# Patient Record
Sex: Female | Born: 1975 | Race: Black or African American | Hispanic: No | Marital: Single | Smoking: Current every day smoker
Health system: Southern US, Community
[De-identification: ages and names within clinical notes are randomized; demographics above are authoritative.]

## PROBLEM LIST (undated history)

## (undated) DIAGNOSIS — D649 Anemia, unspecified: Secondary | ICD-10-CM

## (undated) DIAGNOSIS — I2699 Other pulmonary embolism without acute cor pulmonale: Secondary | ICD-10-CM

## (undated) DIAGNOSIS — M5430 Sciatica, unspecified side: Secondary | ICD-10-CM

## (undated) DIAGNOSIS — G8929 Other chronic pain: Secondary | ICD-10-CM

## (undated) DIAGNOSIS — M549 Dorsalgia, unspecified: Secondary | ICD-10-CM

## (undated) DIAGNOSIS — I10 Essential (primary) hypertension: Secondary | ICD-10-CM

## (undated) HISTORY — PX: TUBAL LIGATION: SHX77

## (undated) HISTORY — PX: CHOLECYSTECTOMY: SHX55

---

## 2001-02-15 ENCOUNTER — Encounter: Payer: Self-pay | Admitting: *Deleted

## 2001-02-15 ENCOUNTER — Emergency Department (HOSPITAL_COMMUNITY): Admission: EM | Admit: 2001-02-15 | Discharge: 2001-02-15 | Payer: Self-pay | Admitting: *Deleted

## 2001-06-12 ENCOUNTER — Inpatient Hospital Stay (HOSPITAL_COMMUNITY): Admission: AD | Admit: 2001-06-12 | Discharge: 2001-06-13 | Payer: Self-pay | Admitting: Obstetrics and Gynecology

## 2004-06-13 ENCOUNTER — Ambulatory Visit (HOSPITAL_COMMUNITY): Admission: RE | Admit: 2004-06-13 | Discharge: 2004-06-13 | Payer: Self-pay | Admitting: Obstetrics and Gynecology

## 2004-06-15 ENCOUNTER — Observation Stay (HOSPITAL_COMMUNITY): Admission: AD | Admit: 2004-06-15 | Discharge: 2004-06-15 | Payer: Self-pay | Admitting: *Deleted

## 2004-07-13 ENCOUNTER — Ambulatory Visit: Payer: Self-pay | Admitting: *Deleted

## 2004-07-13 ENCOUNTER — Inpatient Hospital Stay (HOSPITAL_COMMUNITY): Admission: AD | Admit: 2004-07-13 | Discharge: 2004-07-17 | Payer: Self-pay | Admitting: Obstetrics and Gynecology

## 2004-08-18 ENCOUNTER — Observation Stay (HOSPITAL_COMMUNITY): Admission: RE | Admit: 2004-08-18 | Discharge: 2004-08-18 | Payer: Self-pay | Admitting: Obstetrics and Gynecology

## 2004-08-27 ENCOUNTER — Inpatient Hospital Stay (HOSPITAL_COMMUNITY): Admission: AD | Admit: 2004-08-27 | Discharge: 2004-08-31 | Payer: Self-pay | Admitting: Obstetrics and Gynecology

## 2004-09-04 ENCOUNTER — Emergency Department (HOSPITAL_COMMUNITY): Admission: EM | Admit: 2004-09-04 | Discharge: 2004-09-04 | Payer: Self-pay | Admitting: Emergency Medicine

## 2004-09-20 DIAGNOSIS — I2699 Other pulmonary embolism without acute cor pulmonale: Secondary | ICD-10-CM

## 2004-09-20 HISTORY — DX: Other pulmonary embolism without acute cor pulmonale: I26.99

## 2004-09-25 ENCOUNTER — Inpatient Hospital Stay (HOSPITAL_COMMUNITY): Admission: EM | Admit: 2004-09-25 | Discharge: 2004-09-28 | Payer: Self-pay | Admitting: Emergency Medicine

## 2004-10-03 ENCOUNTER — Ambulatory Visit: Payer: Self-pay | Admitting: Cardiology

## 2005-03-06 ENCOUNTER — Emergency Department (HOSPITAL_COMMUNITY): Admission: EM | Admit: 2005-03-06 | Discharge: 2005-03-06 | Payer: Self-pay | Admitting: Emergency Medicine

## 2005-03-06 ENCOUNTER — Observation Stay (HOSPITAL_COMMUNITY): Admission: EM | Admit: 2005-03-06 | Discharge: 2005-03-08 | Payer: Self-pay | Admitting: Emergency Medicine

## 2005-04-13 ENCOUNTER — Emergency Department (HOSPITAL_COMMUNITY): Admission: EM | Admit: 2005-04-13 | Discharge: 2005-04-13 | Payer: Self-pay | Admitting: Emergency Medicine

## 2005-10-06 ENCOUNTER — Emergency Department (HOSPITAL_COMMUNITY): Admission: EM | Admit: 2005-10-06 | Discharge: 2005-10-06 | Payer: Self-pay | Admitting: Emergency Medicine

## 2006-04-02 ENCOUNTER — Inpatient Hospital Stay (HOSPITAL_COMMUNITY): Admission: AD | Admit: 2006-04-02 | Discharge: 2006-04-05 | Payer: Self-pay | Admitting: Obstetrics & Gynecology

## 2006-04-07 ENCOUNTER — Inpatient Hospital Stay (HOSPITAL_COMMUNITY): Admission: EM | Admit: 2006-04-07 | Discharge: 2006-04-11 | Payer: Self-pay | Admitting: Emergency Medicine

## 2006-04-07 ENCOUNTER — Encounter (INDEPENDENT_AMBULATORY_CARE_PROVIDER_SITE_OTHER): Payer: Self-pay | Admitting: *Deleted

## 2006-04-12 ENCOUNTER — Inpatient Hospital Stay (HOSPITAL_COMMUNITY): Admission: EM | Admit: 2006-04-12 | Discharge: 2006-04-18 | Payer: Self-pay | Admitting: Emergency Medicine

## 2006-04-16 ENCOUNTER — Encounter (INDEPENDENT_AMBULATORY_CARE_PROVIDER_SITE_OTHER): Payer: Self-pay | Admitting: *Deleted

## 2006-04-19 ENCOUNTER — Emergency Department (HOSPITAL_COMMUNITY): Admission: EM | Admit: 2006-04-19 | Discharge: 2006-04-19 | Payer: Self-pay | Admitting: Emergency Medicine

## 2006-10-29 ENCOUNTER — Emergency Department (HOSPITAL_COMMUNITY): Admission: EM | Admit: 2006-10-29 | Discharge: 2006-10-29 | Payer: Self-pay | Admitting: Emergency Medicine

## 2007-05-30 ENCOUNTER — Emergency Department (HOSPITAL_COMMUNITY): Admission: EM | Admit: 2007-05-30 | Discharge: 2007-05-30 | Payer: Self-pay | Admitting: Emergency Medicine

## 2007-06-21 ENCOUNTER — Emergency Department (HOSPITAL_COMMUNITY): Admission: EM | Admit: 2007-06-21 | Discharge: 2007-06-21 | Payer: Self-pay | Admitting: Emergency Medicine

## 2010-02-16 ENCOUNTER — Emergency Department (HOSPITAL_COMMUNITY)
Admission: EM | Admit: 2010-02-16 | Discharge: 2010-02-16 | Payer: Self-pay | Source: Home / Self Care | Admitting: Emergency Medicine

## 2010-05-02 LAB — BASIC METABOLIC PANEL
BUN: 10 mg/dL (ref 6–23)
CO2: 27 mEq/L (ref 19–32)
Calcium: 8.7 mg/dL (ref 8.4–10.5)
Creatinine, Ser: 0.75 mg/dL (ref 0.4–1.2)
GFR calc non Af Amer: >60 mL/min (ref >60)
Glucose, Bld: 85 mg/dL (ref 70–99)

## 2010-05-02 LAB — URINALYSIS, ROUTINE W REFLEX MICROSCOPIC
Bilirubin Urine: NEGATIVE
Glucose, UA: NEGATIVE mg/dL
Urobilinogen, UA: 0.2 mg/dL (ref 0.0–1.0)
pH: 6 (ref 5.0–8.0)

## 2010-05-02 LAB — CBC
Hemoglobin: 8.5 g/dL — ABNORMAL LOW (ref 12.0–15.0)
MCH: 25.1 pg — ABNORMAL LOW (ref 26.0–34.0)
MCHC: 32.1 g/dL (ref 30.0–36.0)
Platelets: 255 10*3/uL (ref 150–400)
RDW: 16.9 % — ABNORMAL HIGH (ref 11.5–15.5)

## 2010-05-02 LAB — WET PREP, GENITAL
Clue Cells Wet Prep HPF POC: NONE SEEN
Trich, Wet Prep: NONE SEEN
Yeast Wet Prep HPF POC: NONE SEEN

## 2010-05-02 LAB — DIFFERENTIAL
Basophils Absolute: 0 10*3/uL (ref 0.0–0.1)
Basophils Relative: 0 % (ref 0–1)
Eosinophils Absolute: 0 10*3/uL (ref 0.0–0.7)
Monocytes Relative: 5 % (ref 3–12)
Neutro Abs: 3.4 10*3/uL (ref 1.7–7.7)
Neutrophils Relative %: 58 % (ref 43–77)

## 2010-05-02 LAB — URINE MICROSCOPIC-ADD ON

## 2010-05-02 LAB — GC/CHLAMYDIA PROBE AMP, GENITAL: GC Probe Amp, Genital: NEGATIVE

## 2010-07-08 NOTE — Discharge Summary (Signed)
NAME:  [name], [name] NO.:  [id number]   MEDICAL RECORD NO.:  [id number]          PATIENT TYPE:  INP   LOCATION:  A401                          FACILITY:  [facility]   PHYSICIAN:  Langley Gauss, MD     DATE OF BIRTH:  [birth year]   DATE OF ADMISSION:  08/27/2004  DATE OF DISCHARGE:  07/12/2006LH                                 DISCHARGE SUMMARY   The patient's observation status of August 27, 2004, converted to admission  status on August 28, 2004, with subsequent induction of labor on August 29, 2004,  with spontaneous assisted vaginal delivery over intact perineum also to  occurring on August 29, 2004.  Postpartum hospital care August 30, 2004, and  then discharged home on August 31, 2004.   DISPOSITION:  The patient given a copy of standard discharge instructions.   FOLLOW UP:  Follow up in the office in 4 weeks' time.  However, she should  present to the office prior to that time if she does desire permanent  sterilization as she is made aware that there is a 30-day waiting period  with Medicaid prior to performing the procedure.  This paper is available in  our office to be signed.   DISCHARGE MEDICATIONS:  1.  Tylox #20, with no refill.  2.  HCTZ 25 mg p.o. q.d. p.r.n. fluid retention, #30, with no refill.   LABORATORY DATA AND X-RAY FINDINGS:  On August 28, 2004, hemoglobin/hematocrit  10.3/29.7, white count of 7.2.  On August 29, 2004, hemoglobin 10.3,  hematocrit 30.0, white count 9.6.  On August 30, 2004, hemoglobin 10.6,  hematocrit 30.6, white count 16.1.  This was immediately obtained on  postpartum day #1.  Electrolytes within normal limits.  Urine drug screen  positive for THC only.  Additionally, urinalysis did reveal presence of  Trichomonas, thus she is continued on Flagyl 500 mg p.o. b.i.d. x7 days.  GBS carrier status negative.  This was obtained during the previous visit to  labor and delivery.   HISTORY OF PRESENT ILLNESS:  The patient states she had prenatal  care in  [place], but no records were ever supplied.  EDC was September 03, 2004.  The  patient presented the p.m. of August 27, 2004, as an OB unassigned patient  complaining of headache, backache, uterine contractions and abdominal pain.  She was initially placed on observation on August 27, 2004.  She was noted to  have mildly elevated blood pressures as well as 2+ proteinuria.  Her blood  pressure failed to normalize at bed rest and in addition, the 2+ proteinuria  persisted even after beginning treatment for the Trichomonas in the urine  with p.o. Flagyl.  The patient was admitted on August 28, 2004.   HOSPITAL COURSE:  A limited OB ultrasound greater than 14 weeks' gestation  performed on emergent basis by Dr. Lisette Grinder on August 28, 2004, which revealed  footling breech presentation.  Thus, the patient was scheduled for primary  low transverse cesarean section in a.m. of August 29, 2004.  However, I again  evaluated the patient on August 29, 2004, again limited OB ultrasound greater  than 14 weeks' gestation revealed spontaneous version to a cephalic  presentation.  Thus, induction of labor was performed.  Amniotomy was  performed.  Pitocin was initiated and the patient progressed to a vaginal  delivery over an intact perineum.  Postpartum, the patient was to be  continued on IV magnesium sulfate x24 hours for seizure prophylaxis.  However, her IV infiltrated several hours postpartum.  She refused to  restart and she refused administration of IV antibiotics.  This is typical  behavior in this noncompliant patient who has had no prenatal care other  than two prior visits to labor and delivery.  Subsequently, in the  postpartum state, her blood pressures have been 140s/90s, pulse of 80-90 and  respiratory rate is 20.  She does have significant dependent edema for which  the hydrochlorothiazide is administered.  The patient does desire infant  circumcision, but she is unable to meet her financial obligations  this  elective procedure.  Thus, she can arrange to have it done on an outpatient  basis if she so desires.       DC/MEDQ  D:  08/31/2004  T:  08/31/2004  Job:  [id number]

## 2010-07-08 NOTE — Group Therapy Note (Signed)
NAME:  [name], HANSELL NO.:  [id number]   MEDICAL RECORD NO.:  [id number]          PATIENT TYPE:  INP   LOCATION:  A401                          FACILITY:  [facility]   PHYSICIAN:  Langley Gauss, MD     DATE OF BIRTH:  [birth year]   DATE OF PROCEDURE:  08/30/2004  DATE OF DISCHARGE:                                   PROGRESS NOTE   POSTPARTUM DAY #1:  Apparently, the patient delivered vaginally late last  p.m.  Plan was to continue the magnesium sulfate x 24 hours' duration.  However, the IV infiltrated shortly after delivery.  The patient adamantly  refused restarting any IV and also refused administration of IM medications.  Thus, it was impossible to continue her magnesium sulfate for seizure  prophylaxis.  The patient was made aware that that was the entire indication  for the induction of labor was due to the presence of the preeclampsia, and  there was a risk of seizure, particularly within the first 24 hours  postpartum.  However, during this hospital stay as well as two other  previous visits to labor and delivery, the patient has shown an outright  refusal to do as directed such as following up with outpatient visits and  has, likewise on several occasions, refused medications, even though she  should realize it is only in her best interests.  The patient was  subsequently started on labetalol 100 mg p.o. b.i.d. to keep blood pressures  within an acceptable range.  Blood pressures have been primarily in the 130s-  140s/80s.  Abdomen and uterus are soft and nontender.  No uterine  tenderness.  No excessive vaginal bleeding.  She does have some dependent  edema.   POSTPARTUM LABORATORY STUDIES:  Hemoglobin 10.6, hematocrit 30.6, platelet  count of 229,000, white blood count minimally elevated at 16.1.  Electrolytes within normal limits.   ASSESSMENT:  1.  Postpartum day #1.  The patient induced and delivered with the      indication of a 39-week gestation.  2.   Pregnancy-induced hypertension.  The patient declined restarting her IV      last night.  Thus, IV magnesium sulfate was discontinued.  In addition,      she refused IM medications.  She, however, at present, is stable, up and      ambulatory, and seems to be doing fairly well in a postpartum state.       DC/MEDQ  D:  08/30/2004  T:  08/30/2004  Job:  [id number]
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
## 2010-07-08 NOTE — H&P (Signed)
NAME:  [name], [name] NO.:  [id number]   MEDICAL RECORD NO.:  [id number]          PATIENT TYPE:  OBV   LOCATION:  A415                          FACILITY:  [facility]   PHYSICIAN:  Lazaro Arms, M.D.   DATE OF BIRTH:  12-03-1975   DATE OF ADMISSION:  04/01/2006  DATE OF DISCHARGE:  LH                              HISTORY & PHYSICAL   HISTORY OF PRESENT ILLNESS:  [name] is a 35 year old grand multipara N  with unknown EDC, with low-grade fever, back pain, leg pain, body pains,  headache, cough.  She has neglected during the course of this pregnancy  to get prenatal care.  She is noncompliant in the management of her  hypertension and also her history of pulmonary embolus.  She is supposed  to be on a blood thinner, but she does not take anything.   MEDICAL HISTORY:  1. Hypertension.  2. Pulmonary embolus.   SURGICAL HISTORY:  Negative.   ALLERGIES:  She has no known allergies.   MEDICATIONS:  She is not taking any medications right now, although she  is supposed to be on medication for her blood pressure and for her  history of pulmonary embolus.   LABORATORIES:  Her hemoglobin is 7.4, hematocrit is 24.2, WBC is 8.9.  I  discussed with her the importance of taking the medications I am going  to give her in regard to her low hemoglobin, and also her potassium is  3.   PLAN:  Going to get an influenza A and B.  Get an OB ultrasound to  assess gestational age.  Talked with Dr. Despina Hidden in regard to plan and  care.  Going to get her in this week to the office in regard to therapy  for her previous pulmonary emboli.      [name], N.M.      Lazaro Arms, M.D.  Electronically Signed   DL/MEDQ  D:  16/11/9602  T:  04/02/2006  Job:  [id number]   cc:   [facility]

## 2010-07-08 NOTE — Discharge Summary (Signed)
NAME:  [name], [name] NO.:  [id number]   MEDICAL RECORD NO.:  [id number]          PATIENT TYPE:  INP   LOCATION:  A307                          FACILITY:  [facility]   PHYSICIAN:  Dalia Heading, M.D.  DATE OF BIRTH:  1975/03/17   DATE OF ADMISSION:  04/12/2006  DATE OF DISCHARGE:  02/27/2008LH                               DISCHARGE SUMMARY   HOSPITAL COURSE SUMMARY:  The patient is a 35 year old black female  status post C-section who presented to [facility] with  worsening epigastric pain, nausea and vomiting.  She had a history of  elevated liver enzyme tests, and she was also noted to have elevated  amylase and lipase.  Her ultrasound of the gallbladder revealed sludge.  Dr. Despina Hidden of gynecology admitted the patient for further evaluation and  treatment.  A surgical consultation was obtained, and it was felt that  the patient had suffered a gallstone pancreatitis.  Once her amylase and  lipase normalized, she was taken to the operating room on April 16, 2006 and underwent a laparoscopic cholecystectomy.  She tolerated the  procedure well.  Her postoperative course has been for the most part  unremarkable.  Her liver enzyme tests have been returning to normal.  Her amylase is normal on discharge.  Her lipase is only mildly elevated  in the 300 range.   The patient is being discharged home in good and stable condition.   DISCHARGE INSTRUCTIONS:  The patient is to follow up with Dr. Franky Macho on April 24, 2006.   DISCHARGE MEDICATIONS:  1. Phenergan 25 mg suppositories one per rectum q.6 h p.r.n. nausea.  2. Percocet 1-2 tablets p.o. q.4h. p.r.n. pain.  3. She is to resume all her home medications as per previously      prescribed.   PRINCIPAL DIAGNOSIS:  Gallstone pancreatitis.   PRINCIPAL PROCEDURE:  Laparoscopic cholecystectomy on April 16, 2006.      Dalia Heading, M.D.  Electronically Signed     MAJ/MEDQ  D:  04/18/2006  T:   04/18/2006  Job:  [id number]   cc:   Tilda Burrow, M.D.  Fax: [phone]

## 2010-07-08 NOTE — H&P (Signed)
[facility]  Patient:    [name], [name] Visit Number: [id number] MRN: [id number]          Service Type: OBS Location: 4A A428 01 Attending Physician:  Tilda Burrow Dictated by:   [name], M.D. Admit Date:  06/12/2001   CC:         [facility]   History and Physical  DATE OF BIRTH:  [birth year]  ADMISSION DIAGNOSES:  Pregnancy at 40 weeks with spontaneous rupture of membranes.  HISTORY OF PRESENT ILLNESS:  The patient is a 35 year old gravida 3, para 4, due April 21, late.  Ultrasound done in the emergency department per Dr. Emelda Fear.  The patient has had no prenatal care except for one ER visit with ultrasound at that time.  PAST MEDICAL HISTORY:  Negative.  PAST SURGICAL HISTORY:  Negative.  ALLERGIES:  PENICILLIN.  SOCIAL HISTORY:   She is single.  She lives with her mom and her four other children.  PRENATAL COURSE:  To my knowledge and per patient has been uneventful.  PHYSICAL EXAMINATION:  VITAL SIGNS:  Stable.  Estimated fetal weight approximately 7 to 8 pounds.  PELVIC:  Cervix is 1 cm, 50% effaced, -2 to -3 station, with spontaneous rupture of membranes noted.  PLAN:  We are going to admit, start IV Pitocin for augmentation, give her some IV Cleocin due to her unknown GBS status.  Also, Dr. Emelda Fear was made aware of the patients admission, condition, and treatment plan. Dictated by:   [name], M.D. Attending Physician:  Tilda Burrow DD:  06/12/01 TD:  06/12/01 Job: [id number]

## 2010-07-08 NOTE — Op Note (Signed)
NAME:  ORAH, SONNEN NO.:  [id number]   MEDICAL RECORD NO.:  [id number]          PATIENT TYPE:  INP   LOCATION:  A415                          FACILITY:  [facility]   PHYSICIAN:  Langley Gauss, MD     DATE OF BIRTH:  March 04, 1975   DATE OF PROCEDURE:  08/29/2004  DATE OF DISCHARGE:                                 OPERATIVE REPORT   The patient is noted to have elevated blood pressure this morning. She did  receive on single dose of 5 mg of IV Apresoline and subsequently started on  IV magnesium sulfate. Limited OB ultrasound greater than 14 weeks performed  by Dr. Langley Gauss had revealed pertinent additional finding now of  cephalic presentation of the infant's vertex. Subsequently, when I returned  to evaluate the patient for induction of labor, Leopold's maneuvers again  confirmed cephalic presentation. When I attempted to perform amniotomy,  cervix noted be 1 cm dilated, -3 station, 60% effaced and far posterior. I  was unable to successfully perform the amniotomy or unable to place the  fetal scalp electrode. I had considered placing a Foley bulb for ripening of  the cervix prior to amniotomy, but with the recent spontaneous conversion  from a footling breech to a vertex presentation, I was dually concerned that  the Foley bulb may result in elevation of the presenting head and possibly  contribute to another spontaneous conversion to other than cephalic  presentation. Thus, I have opted to start Pitocin induction at 2 milliunits  per minute increasing by 2 milliunits per minute to achieve functional labor  and subsequently can reassess for engagement of the fetal vertex.       DC/MEDQ  D:  08/29/2004  T:  08/29/2004  Job:  [id number]
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
## 2010-07-08 NOTE — H&P (Signed)
NAME:  [name], [name] NO.:  [id number]   MEDICAL RECORD NO.:  [id number]          PATIENT TYPE:  OIB   LOCATION:  LDR3                          FACILITY:  [facility]   PHYSICIAN:  Tilda Burrow, M.D. DATE OF BIRTH:  10/12/1975   DATE OF ADMISSION:  08/18/2004  DATE OF DISCHARGE:  LH                                HISTORY & PHYSICAL   OBSERVATION NOTE:   REASON FOR ADMISSION:  Pregnancy at approximately 69 weeks' gestation, no  prenatal care, and with complaints of swelling.  Upon admission her blood  pressures were elevated, but after resting for several hours came down to a  normal level.  All preeclamptic laboratory tests were normal.   This is [name]'s second visit here at the hospital.  With the first one,  she was encouraged to get prenatal care, but as of yet has not done so.  She  started on a 24-hour urine, and she has been given careful directions on now  to collect that, and she knows she is to present to our office in the  morning for assessment of that 24-hour urine and to begin her prenatal care.   Vital signs this morning are stable.  Blood pressures are 110s/70s.  She  does have some edema in her lower extremities, approximately 2+, but no  hyperreflexia or no clonus.  Also, she denies headache, blurred vision, any  epigastric pain.  She does have a UTI for which I am going to send her home  with Macrobid b.i.d. for 10 days, and she knows she is to follow up in our  office the first thing in the morning.       DL/MEDQ  D:  56/21/3086  T:  08/18/2004  Job:  [id number]   cc:   Birdie Riddle

## 2010-07-08 NOTE — Discharge Summary (Signed)
NAME:  [name], FALLER NO.:  [id number]   MEDICAL RECORD NO.:  [id number]          PATIENT TYPE:  INP   LOCATION:  A415                          FACILITY:  [facility]   PHYSICIAN:  Tilda Burrow, M.D. DATE OF BIRTH:  [birth year]   DATE OF ADMISSION:  04/01/2006  DATE OF DISCHARGE:  02/14/2008LH                               DISCHARGE SUMMARY   ADMITTING DIAGNOSES:  1. Pregnancy [redacted] weeks gestation.  2. Minimal prenatal care.  3. Severe anemia.  4. Chronic hypertension.  5. History of pulmonary embolus.  6. Not complying with anticoagulation therapy.   HPI:  This 35 year old gravida 7, para 6, unknown EDC, was admitted by  Dr. Turner Daniels and [name], C.N.M., on April 02, 2006, with an  incapacitatingly sore throat, body pains, leg pain, back pain, low grade  fever and cough.  She had no prenatal care to date.  She was dramatic in  her presentation, very sore from coughing and uncooperative with efforts  at communication.   HOSPITAL COURSE:  The patient was admitted, had urine drug screen  performed which showed patient negative for benzodiazepines, cocaine,  amphetamines, cannabinoids or opiates or barbiturates.  Group B strep  was negative.  Urinalysis was negative for protein.  Prenatal 1 profile  was unremarkable with RPR nonreactive, rubella immunity present.  She  had an obstetric ultrasound performed on February 11 revealing a 7 pound  7 ounce estimated fetal weight term fetus in vertex presentation with  normal fetal survey.  The early care focused on her incapacitatingly  sore throat and challenging personality.  She was given Tussionex,  Viscous Xylocaine and multiple symptomatic analgesics at _prior v isits_  , she was given Protonix as well as IV Phenergan.  On April 02, 2006,  due to patient noncompliance and inability to swallow, we gave her IV  iron dextran, 115 mg of INFeD administered over 6 hours as per pharmacy  protocol.  She was  subsequently treated also with guaifenesin and  reassessed.  The patient had liver function test which showed an AST  normal at 31, ALT of 11 with total bilirubin normal at 0.5.  Hepatitis  and HIV were negative.  She was subsequently only slow and gradual in  her improvement, but on April 05, 2006, she was slightly better from  the coughing, lungs were clear, chest x-ray showed no acute disease.  She was considered still coughing less but still stick, desired to go  home and was therefore discharged home on:  1. Tussionex.  2. Viscous Xylocaine 2 tsp q.4h. for sore throat.  3. Repliva iron supplementation one daily.  4. Aldomet 500 mg twice daily.  5. K-Dur 20 mEq twice per day.   For followup April 09, 2006, at [facility] for non-  stress test.  Anticipate that the patient will deliver shortly.      Tilda Burrow, M.D.  Electronically Signed     JVF/MEDQ  D:  05/09/2006  T:  05/10/2006  Job:  [id number]

## 2010-07-08 NOTE — Op Note (Signed)
NAME:  [name], [name] NO.:  [id number]   MEDICAL RECORD NO.:  [id number]          PATIENT TYPE:  INP   LOCATION:  A401                          FACILITY:  [facility]   PHYSICIAN:  Langley Gauss, MD     DATE OF BIRTH:  December 28, 1975   DATE OF PROCEDURE:  08/29/2004  DATE OF DISCHARGE:                                 OPERATIVE REPORT   DIAGNOSES:  1.  38+ week intrauterine pregnancy.  2.  Is pregnancy-induced hypertension.   DELIVERY PERFORMED:  A spontaneous assisted vaginal delivery of 5 pound 8  ounce female infant delivered over an intact perineum.  Delivery performed by  Dr. Langley Gauss.   ESTIMATED BLOOD LOSS:  Less than 500 mL.   COMPLICATIONS:  None.   SPECIMENS:  Arterial cord gas and cord blood for laboratory analysis.  The  placenta was examined and noted to be apparently intact with a three-vessel  umbilical cord. Placenta was likewise sent to pathology laboratory.   ANALGESIA:  The patient received only IV narcotics during the course of  labor. Total estimated blood loss was less than 500 mL.   SUMMARY:  The patient presented to [facility] the p.m. of  August 27, 2004. She had had two previous prenatal visits to labor and delivery only.  Thus she presented as an OB unassigned.  The patient was again on this  occasion noted to have elevated blood pressures; however, they did not  normalize at bed rest.  In addition, she was noted to have 2+ proteinuria.  Additional findings was that of Trichomonas on her urinalysis indicating  heavy colonization of the vagina.  The patient is noted be GBS carrier  status negative.   SUMMARY:  The patient is a gravida 6, para 5.  Of note during this hospital  course, ultrasound limited OB greater than [redacted] weeks gestation performed by  myself on labor and delivery on an emergent basis on August 28, 2004 revealed  footling breech presentation. Thus the patient was scheduled for primary  cesarean section for the a.m.  of August 29, 2004; however, due to the finding  of vertex presentation one week previously on ultrasound and now footling  breech presentation, it was very astute clinically that the early a.m. of  August 29, 2004, prior to the scheduled C-section Dr. Lisette Grinder performed a  limited OB ultrasound greater than [redacted] weeks gestation to again assess the  pregnancy. Ultrasound August 29, 2004, revealed cephalic presentation. Thus  the primary cesarean section was cancelled with plans that the patient could  deliver vaginally with a history of five prior vaginal delivery. She did  have some elevated blood pressures again the a.m. of August 29, 2004 with  findings of a blood pressure of 150/110.  Thus she was started on IV  magnesium sulfate.  Initial digital examination reveals cervix to be 2 cm  dilated, -3 station with the vertex not engaged; however, the vertex was  confirmed on digital examination.  I was unable to proceed with amniotomy.  Pitocin induction was initiated at 2 milliunits per minute and  Pitocin was  increased 2 milliunits per minute to achieve a functional labor pattern.  This did result in some cervical change with descent of the vertex to -1  station but it was noted be well applied to the cervix.  At that point in  time, amniotomy was performed with findings of clear amniotic fluid with a  fetal scalp electrode was placed.  The patient did subsequently require  increasing dosages of Pitocin induction to achieve a functional labor  pattern.  She requested IV narcotics only.  Blood pressures during the  course of labor primarily in the 150s/100.  Thus no additional IV  Apressoline was administered.  The patient subsequently upon reaching the  active phase of labor progressed very rapidly to complete dilatation at  which time she had a very strong urge to push.  At that point, she is placed  in dorsolithotomy position, prepped and draped usual sterile manner. She  pushed very well during a  short second stage of labor to delivery in a  direct OA position over an intact perineum.  A loose nuchal cord times one  with some compression during the second stage of labor was noted at time of  delivery. Mouth and nares of the infant were bulb suctioned of clear  amniotic fluid. Spontaneous rotation occurred to a left anterior shoulder  position. Gentle downward traction combined with expulsive efforts resulted  in delivery of this anterior shoulder beneath the pubic symphysis without  difficulty. The remainder the infant also delivered without difficulty. A  spontaneous and vigorous breath and cry is noted. Umbilical cord is then  milked toward the infant.  Cord was doubly clamped and cut and infant is  placed on maternal abdomen for immediate bonding purposes. Arterial cord gas  and cord blood were then obtained. Gentle traction on umbilical cord  resulted in separation which upon examination is noted be intact placenta  was associated three-vessel umbilical cord. Arterial cord gas and cord blood  having been previously obtained.  Excellent uterine tone is achieved  following delivery. No excessive bleeding is noted. The patient is taken out  dorsal lithotomy position. The patient does desire permanent sterilization.  However, she has not met her requirement of signing tubal ligation papers 30  days in advance. It was discussed with her that tubal ligation would not  have been performed whether the patient delivered vaginally or whether she  delivered by cesarean section. The patient is now to be continued on IV  magnesium sulfate seizure prophylaxis times 24 hours postpartum.       DC/MEDQ  D:  08/29/2004  T:  08/30/2004  Job:  [id number]

## 2010-07-08 NOTE — H&P (Signed)
NAME:  [name], [name] NO.:  [id number]   MEDICAL RECORD NO.:  [id number]          PATIENT TYPE:  OBV   LOCATION:  A210                          FACILITY:  [facility]   PHYSICIAN:  Osvaldo Shipper, MD     DATE OF BIRTH:  06/12/1975   DATE OF ADMISSION:  03/06/2005  DATE OF DISCHARGE:  LH                                HISTORY & PHYSICAL   NOTE:  The patient is supposed to see Dr. Early Chars and has not seen him.   ADMISSION DIAGNOSES:  1.  Angioedema, unclear etiology.  2.  History of pulmonary embolus with incomplete treatment and      noncompliance.  3.  Chronic anemia.   CHIEF COMPLAINT:  Swelling of the face for the past one day.   HISTORY OF PRESENT ILLNESS:  The patient is a 35 year old African American  female who was admitted to our service back in August 2006 with a diagnosis  of acute pulmonary embolus.  The patient was started on Lovenox and Coumadin  at that time.  She also was anemic and had recently had a childbirth.  She  says that she took Coumadin for about 1-1/2 months and then stopped on her  own.  Upon asking why she did that she was not able to give a good  explanation.   The patient states she was doing well until last night when she started  noticing hot feelings in her face with swelling below her right eye.  She  did not think much about it and slept.  This morning, when she woke up she  could barely open her eyes but she was able to do so.  She had swelling all  over the face.  She did not have any shortness of breath.  She did have some  wheezing this morning.  She did not feel like her throat was closing up.  She did feel mildly short of breath.  She came into the ED this morning and  was asked to take over-the-counter Benadryl and was discharged home.  The  patient has been taking Benadryl at home.  However, she has not noted any  relief of her symptoms, however, actually her symptoms have gotten worse.  She said the swelling around her eyes  has increased so much that she is not  able to open her eyes at all at this time.  Once again, she does not mention  any difficulty with breathing.  There is no chest pain.  She does mention  some neck pain which she has had in the past.   The patient denies taking any medications recently.  She said she took  NyQuil about two weeks ago for a runny nose which has since resolved.  She  took NyQuil for about two days.  She has not had any sick contacts.  No  travel outside this region.  She does not have a history of seasonal  allergies.  She did not have similar symptoms in the past.  She does not  know of any family members who had similar problems in the  past.  She is  also complaining of mild itching in her face and her chest wall region.  She  does not give a history of any other skin lesions in any other part of her  body.  Her last menstrual period was February 22, 2005.   MEDICATIONS:  Currently she is on no medications.  As mentioned above, she  discontinued her Coumadin herself about 2-1/2 months ago.   ALLERGIES:  PENICILLIN which causes a rash.   PAST MEDICAL HISTORY:  1.  History of chronic anemia.  2.  Peripartum hypertension.  3.  History of pulmonary embolism in August 2006 for which she had Coumadin      for just 1-1/2 months and then was noncompliant.   SOCIAL HISTORY:  She lives in [place] with her mother and children.  Currently unemployed.  Smokes 3-4 cigarettes per day.  No alcohol use.  Denies any illicit drug use.   FAMILY HISTORY:  No history of allergies.  No history of heart disease in  the family.  No history of angioedema in the family.  There is a history of  uterine and breast cancer in the family.  She also mentioned that her mother  and grandmother had been diagnosed with blood clot formations in the past.   REVIEW OF SYSTEMS:  A 10-point review of systems is unremarkable except as  mentioned in the HPI.   PHYSICAL EXAMINATION:  VITAL SIGNS:   Temperature 97.1, blood pressure  110/91, heart rate 75, respiratory rate 18.  Saturation 100% on room air.  GENERAL:  This is an overweight woman in mild discomfort because of  significant swelling of her face.  HEENT:  The patient has diffuse swelling of her face, mostly involving the  periorbital regions.  She is barely able to open her eyes.  She has  tenderness when I try to open her eyes.  Both ears show tympanic membranes  with good light reflex.  Throat examination was unremarkable.  Mild swelling  noted of the tongue otherwise unremarkable.  NECK:  Soft and supple.  There is some tenderness over her cervical spine  area.  Range of motion is intact.  LUNGS:  Clear to auscultation bilaterally.  No wheezing heard at this time.  CARDIOVASCULAR:  S1 and S2 is normal.  Regular.  No murmurs appreciated.  ABDOMEN:  Soft and nontender, nondistended.  Bowel sounds present.  No mass  or organomegaly.  EXTREMITIES:  Without any edema.  Peripheral pulses are palpable.  No calf  tenderness present.  DERMATOLOGIC:  No urticarial or other skin lesions noted.  There is a dry  area in her right upper arm but again but no other lesions are noted.   LABORATORY DATA:  CBC shows white count of 5.1.  Differential is completely  normal.  Hemoglobin 10.1, MCV 85.  Platelet count 284,000.  PT/INR/PTT  normal.  CMP shows a bilirubin of 0.2, albumin 2.9, calcium 8.3, otherwise  completely  unremarkable.   No imaging studies have been done.   IMPRESSION:  This is a 35 year old [place] American female with a past  history of PE who presents with signs and symptoms of angioedema.  Etiology  is completely unclear at this time.  There is no precipitant cause or agent  that can be identified based on history alone.  The patient is in some  discomfort because of the swelling.  Her airway is intact.  She is able to  maintain her respirations.   PLAN:  1.  For the angioedema, we will give the patient steroids,  Pepcid, albuterol      nebulizer and we will give her Benadryl.  We will give her one dose of      Dilaudid for the pain.  Workup will include ESR and complements,      immunoglobulins, C-MET, c-1 inhibitors, cryoglobulins, and hepatitis B      serologies.  We will observe the patient in the hospital. Hopefully, she      should improve with this management and can be worked up as an      outpatient by an allergist/immunologist.  If she does not improve or if      she worsens we may need to consider getting a consultation from an      allergist.  2.  History of pulmonary embolism:  The patient took Coumadin just for 1-1/2      months.  That does not seem to be an active issue at this time.  Since      she did give a family history of clot formation and since she is not on      Coumadin at this time I will go ahead and do a hypercoagulable workup as      well on this patient.  It is unclear to me because of her incomplete      treatment should she given Coumadin at this time or not.  We will need      to discuss this with the specialist tomorrow morning.  3.  Anemia appears to be chronic.  She has had iron studies in the past.      This was done back in August 2006 and suggestive iron deficiency anemia.      Hence, she was started on ferrous sulfate last admission, however, she      has not been taking it.  4.  Deep vein thrombosis prophylaxis will be initiated in this patient.   Further management and decisions will be based on the results of initial  testing and the patient's response to treatment.      Osvaldo Shipper, MD  Electronically Signed     GK/MEDQ  D:  03/06/2005  T:  03/06/2005  Job:  [id number]   cc:   Dorthula Rue. Early Chars, MD  Fax: [phone]
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
## 2010-07-08 NOTE — Consult Note (Signed)
[facility]  Patient:    [name], [name] Visit Number: [id number] MRN: [id number]          Service Type: EMS Location: ED Attending Physician:  Ilean Skill Dictated by:   Christin Bach, M.D. Proc. Date: 02/15/01 Admit Date:  02/15/2001 Discharge Date: 02/15/2001                            Consultation Report  CHIEF COMPLAINT:  Gush of fluid per vagina.  HISTORY OF PRESENT ILLNESS:  A 35 year old female gravida 5, para 4 with reportedly monthly bleeding through September, October, November, thus with uncertain LMP with obvious evidence of pregnancy with gravid uterus to above the umbilicus who is seen in the emergency room after presenting at 4 a.m. complaining of having felt a pop between her legs and presenting with a reported history of a gush of fluid without bleeding.  The patient has not seen anybody for this pregnancy.  Is uncertain of her plans for the pregnancy and is actually hopeful that the membranes ruptured as she does not wish for the pregnancy, does not wish to have the baby, but could not consider adoption.  Last menstrual period initially reported in August but then she stated she had bled in September, October, and November.  Story is somewhat unreliable as patients motivation for pregnancy loss is that she wishes for this to just simply "take care of the pregnancy."  PAST OBSTETRICAL HISTORY:  Four vaginal deliveries.  PHYSICAL EXAMINATION  GENERAL:  Somber, disheveled African-American female with poor personal hygiene.  ABDOMEN:  Gravid uterus above the umbilicus.  PELVIC:  Speculum examination reveals heavy leukorrhea with wet prep pending at the time of this dictation.  GC and chlamydia culture obtained.  LABORATORIES:  Obstetric ultrasound is obtained which shows a single intrauterine pregnancy at 23 weeks 4 days gestational age placing ultrasound [facility] June 10, 2001 +/- 14 days.  Fetal survey did not include spine  evaluation due to fetal position.  The amniotic fluid volume is normal with AFI 13.8 with no acute abnormalities noted.  Wet prep results show clue cells present, but no Trichomonas or yeast. Hemoglobin 9, hematocrit 26.  Quantitative hCG [id number].  IMPRESSION:  Intrauterine pregnancy at 23 weeks.  No prenatal care to date. Anemia of nutritional origin.  Bacterial vaginosis.  Social factors warranting [facility] referral.  PLAN: 1. Given prescription for metronidazole 500 mg p.o. b.i.d. for seven days for    patient and partner. 2. Prenatal vitamins and iron prescription given. 3. Follow up one week for initiation of prenatal care at our office.  Will    continue to pursue [facility] involvement as patient decides whether to keep    pregnancy or consider adoption. 4. Patient advised to make family aware of pending pregnancy so that issues    can be dealt with.  Patient is made aware that she is too far along to    consider termination. Dictated by:   Christin Bach, M.D. Attending Physician:  Ilean Skill DD:  02/15/01 TD:  02/15/01 Job: [id number]

## 2010-07-08 NOTE — Op Note (Signed)
[facility]  Patient:    [name], [name] Visit Number: [id number] MRN: [id number]          Service Type: OBS Location: 4A A428 01 Attending Physician:  Tilda Burrow Dictated by:   [name], N.M. Proc. Date: 06/12/01 Admit Date:  06/12/2001   CC:         [facility]   Operative Report  DELIVERY SUMMARY  ONSET OF LABOR:  June 12, 2001 at 1:00 p.m.  DATE OF DELIVERY:  June 12, 2001 at 18:38 p.m.  LENGTH OF FIRST STAGE LABOR:  5 hours and 34 minutes.  LENGTH OF SECOND STAGE LABOR:  4 minutes.  LENGTH OF THIRD STAGE LABOR:  5 minutes.  DELIVERY NOTE:  Keary had a spontaneous vaginal delivery of a viable female infant with a nuchal cord x1 loose. Infant delivered spontaneously without any complications with intact perineum. Upon delivery, the infant was active, vigorous, alert, strong cry, pinked up well. The infant was dried and cord clamped and given to the delivery room nurses for attendance. Apgars were 9 and 9. Due to mother having no prenatal cord, placenta was sent to pathology. Placenta was delivered spontaneously via Schultze mechanism. Membranes were intact. Estimated blood loss was approximately 300 cc. Hemabate 125 mcg was given IM as prophylaxis due to the fact the patient had an admit hemoglobin of 8. Infant and mother were both stabilized and transferred up to the postpartum unit in stable condition. Dictated by:   [name], N.M. Attending Physician:  Tilda Burrow DD:  06/12/01 TD:  06/13/01 Job: [id number]

## 2010-07-08 NOTE — Discharge Summary (Signed)
NAME:  [name], [name] NO.:  [id number]   MEDICAL RECORD NO.:  [id number]          PATIENT TYPE:  INP   LOCATION:  A314                          FACILITY:  [facility]   PHYSICIAN:  Osvaldo Shipper, MD     DATE OF BIRTH:  1975/08/16   DATE OF ADMISSION:  09/25/2004  DATE OF DISCHARGE:  08/09/2006LH                                 DISCHARGE SUMMARY   DISCHARGE DIAGNOSES:  1.  Acute pulmonary embolus.  2.  Depression.  3.  Anxiety disorder.  4.  Pneumonia.   Please read H&P dictated 8/6 for details regarding patient's present  illness.   HOSPITAL COURSE:  1.  PE.  The patient is a 35 year old African-American female who had      history of recent child birth about one month ago.  The baby was      delivered by Dr. Lisette Grinder.  She presented to the ED with chest pain and      shortness of breath for two days.  The patient underwent a CAT scan of      the chest which showed PE in the right lung.  The patient was      subsequently admitted and started on Lovenox.  However, the patient      continued to complain of severe pleuritic chest pain on the right side.      Repeat x-ray shows possible atelectasis at the base.  The patient was      put on narcotic agents as well as NSAIDs.  She was given incentive      spirometry.  Subsequent x-ray shows improvement in the atelectatic      changes.  The patient's symptoms subsequently improved.  Her oxygenation      was maintained above 95% on room air.  Other vital signs are also      stable.  Since the patient had recent child birth, she mentioned she      bloody spottings vaginally.  We were concerned about retained products      of conception.  Hence, we consulted Dr. Emelda Fear.  He recommended the      patient get a vaginal ultrasound to rule this out, which we did      obtained.  The ultrasound did not reveal any retained products of      conception.  Endometrium was said to be within normal limits.  This is      also  communicated to Dr. Emelda Fear who recommended the patient follow up      with Dr. Lisette Grinder in one to two weeks' time.  Since the patient was not      having a significant amount of bleeding and since her hemoglobin      remained stable, we did discharge her on coumadin and lovenox.   Problem 2. Anemia.  The patient was found to have a hemoglobin of about 9  during this admission.  We checked the  iron profile which revealed iron  deficiency as well as B12 deficiency anemia.  We gave her IM dose of B12 in  the hospital and  put her on oral B12 medication.  The patient does not have  a PMD.  She will need to follow up with Dr. Lisette Grinder, and this will need to  be checked at a later time and date.   Problem 3. Depression and anxiety.  The patient at times was found to be  very tearful and depressed.  She says she has six children whom she is not  able to take care of very well.  Based on the above, I started the patient  on Paxil.  I also consulted social worker, Lovette Cliche, who saw the  patient and spoke with her for a long period of time and tried to ascertain  the real problems.  We also made arrangements for the patient to contact  social services who may be able to help the patient with taking care of her  children and may be able to find her a job as well.   Problem 4.  Pneumonia.  Because the patient was complaining of so much pain,  she was having some cough, and there was some atelectatic changes in the  right lower lobe, I started the patient on Levaquin for a total of seven  days.  It is possible these atelectatic changes are likely secondary to PE,  but because of the patient's severe pleuritic pain and the fact that she was  having cough and had low-grade temperature, she might benefit from a short  course of antibiotics.   DISCHARGE MEDICATIONS:  1.  Lovenox 80 mg subcutaneously q.12h. for 5 days.  2.  Coumadin 5 mg p.o. q.h.s.  3.  Vitamin B12 1000 mg p.o. daily.  4.  Paxil  20 mg p.o. daily.  5.  Ferrous sulfate 325 mg p.o. daily.  6.  Levaquin 500 mg p.o. for four more days.  7.  Vicodin 7.5/500 1-2 tablets every 4 hours as needed for pain, 90 tablets      prescribed.   FOLLOWUP CARE:  1.  An appointment has been made for the patient to go to [facility] clinic for      Coumadin management on Monday, 8/14.  2.  The patient will need to call Dr. Preston Fleeting office for an appointment in      the next one to two weeks.  3.  We did set up an appointment for the patient to see Dr. Sherwood Gambler as an      outpatient.   LABORATORY DATA:  Lab work followup includes a CBC and a PT/INR to be  checked on Monday to ensure the patient's hemoglobin is not falling, to be  sure her INR is therapeutic.   DIET:  The patient is to eat a regular diet.   PHYSICAL ACTIVITY:  No restrictions.   IMAGING STUDIES:  Imaging studies during this hospital stay included a CAT  scan of the chest, chest x-rays, lower extremity Dopplers which were  negative for DVT, and transvaginal ultrasound as discussed above.   CONSULTATIONS:  Phone consultation with Dr. Emelda Fear.       GK/MEDQ  D:  09/29/2004  T:  09/29/2004  Job:  [id number]   cc:   Langley Gauss, MD  [address], [address]  [place]  [place] [address]  Fax: [phone]   Madelin Rear. Sherwood Gambler, MD  [address]  [place]  [place] [address]  Fax: [phone]

## 2010-07-08 NOTE — Discharge Summary (Signed)
NAME:  [name], ZEIMET NO.:  [id number]   MEDICAL RECORD NO.:  [id number]          PATIENT TYPE:  INP   LOCATION:  A413                          FACILITY:  [facility]   PHYSICIAN:  Tilda Burrow, M.D. DATE OF BIRTH:  [birth year]   DATE OF ADMISSION:  04/07/2006  DATE OF DISCHARGE:  02/20/2008LH                               DISCHARGE SUMMARY   ADMITTING DIAGNOSES:  1. Pregnancy at term, not delivered.  2. Chronic hypertension.  3. Superimposed severe preeclampsia.  4. Chronic anemia.  5. Minimal pregnancy care.  6. Right-sided back pain; suspected hepatic etiology.   DISCHARGE DIAGNOSES:  1. Pregnancy at term, delivered.  2. Chronic hypertension.  3. Superimposed severe preeclampsia versus chronic hypertension with      liver disease/  4. Chronic anemia.  5. Minimal prenatal care.  6. Hepatic etiology right-sided back pain.  7. Uncertain fetal status.   PROCEDURE:  1. Admission April 07, 2006, discharged April 11, 2006.      Admitted, magnesium sulfate seizure prophylaxis.  2. Pitocin induction of labor, discontinued.  3. Bilateral tubal ligation (additional right distal salpingectomy).   HOSPITAL COURSE:  This 35 year old female, gravida 7, para 6, unknown  EDC was readmitted for exacerbation and complaints of severe sore  throat, difficulty breathing with an additional finding of right-sided  back pain noted on April 07, 2006.  Evaluation was complicated by her  challenging dramatic histrionic presentation.  Evaluation included an  ultrasound which showed an intrauterine pregnancy at term.  Estimated  fetal weight 7 pounds 7 ounces.  She had severe hyperchromic microcytic  indices.  Of note is last week she was admitted and had received IV iron  therapy as per pharmacy.  At that point in time, she had been admitted  with an AST of 31, ALT of 11.  At this point in time, blood pressure is  dramatically elevated with an SGOT of 800, SGPT of 335.   Albumin low at  2.2.  Total protein 5.9.  She did have an indirect bilirubin which was  noted later as being elevated, as well, at a total bilirubin of 3.1,  elevated direct bilirubin of 1.6 and indirect at 1.5 as of April 07, 2006.   HOSPITAL COURSE:  She was admitted, placed on magnesium sulfate with  uncontrolled blood pressures.  It is noted that she had been  noncompliant with the blood pressure medicines at home.  She would not  allow [place] to catheterize her to check for a proteinuria and could not  give [place] a sample.  We started with magnesium sulfate therapy.  Pitocin  was initiated for induction.   Unfortunately, she developed a nonreassuring fetal heart rate pattern  shortly after labor, and, therefore, it was felt that cesarean delivery  was indicated.  She was taken to the operating room for cesarean section  delivery, delivering a healthy infant, Apgars of 9/9, with clear  amniotic fluid.   POSTPARTUM COURSE:  Post surgical procedure itself was complicated by  hypotension after the spinal.  Even though the patient received 1000 cc  during  the hour prior to cesarean section, she had a hypotensive episode  after the spinal with a blood pressure of 60 systolic, gradually  increasing to 90-110.  The infant was Apgars 2/3/blank/9 at one, five,  ten and fifteen minutes.  See Dr. Samul Dada notes for details.  Once the  case was over, and discussion with anesthesia revealed the hypotensive  episode, this was message was relayed to Dr. Milinda Cave.  This explained  the etiology of the baby's initial hypotension.   Postoperatively, the patient had a magnesium sulfate level in the high  therapeutic range on postoperative day #1.  She continued to complain of  a severe sore throat which had been a problem.  The back pain resolved  spontaneously.  The second day, she was passing gas and doing well.  She  was discontinued on magnesium sulfate therapy on April 10, 2006.   ADDITIONAL  PROBLEMS:  Unstable social situation.  The patient had  claimed that she could not stay here for delivery because of her plans  to fly to [place] and give the baby up for adoption.  This obviously was  stopped due to the urgent nature of her delivery.  Social work was  called to evaluate the patient due to her multiple challenging and  inappropriate behaviors and doubt of her ability to parent a child.  The  patient's mother a primary support and took care of the other babies.  Lucy Antigua, Child psychotherapist, was involved in the patient's assessment.  The following day, the patient had a strong desire to go home.  She  alleged she was still going to give the baby up for adoption, but she  was going to do this privately.  Once social services evaluated the  patient, she was finally considered a stable candidate for going home.  In summary, the patient's unstable social situation was a major part of  challenges taking care of her.  She was discharged home on blood  pressure medications, iron and vitamins with improving hemoglobin and  hematocrit.  A postoperative  hemoglobin was 9.2, hematocrit 27.3,  actually higher than her admission values.  Follow-up will be in 1 week  for an incision check and staple removal, and in 4 weeks for  postoperative visit.   ADDENDUM:  It should be noted that as per the patient's specific  request, she had a bilateral tubal ligation performed at the same time  of the procedure.   It should be noted that postoperative we finally figured out the patient  was having intermittent biliary colic, probably with intermittent  obstruction of the common bile duct.  She was subsequently referred to  Dr. Lovell Sheehan for gallbladder disease.      Tilda Burrow, M.D.  Electronically Signed     JVF/MEDQ  D:  05/09/2006  T:  05/10/2006  Job:  [id number]   cc:   Dr. Lovell Sheehan   [facility]

## 2010-07-08 NOTE — H&P (Signed)
NAME:  [name], [name] NO.:  [id number]   MEDICAL RECORD NO.:  [id number]          PATIENT TYPE:  INP   LOCATION:  A415                          FACILITY:  [facility]   PHYSICIAN:  Tilda Burrow, M.D. DATE OF BIRTH:  [birth year]   DATE OF ADMISSION:  04/07/2006  DATE OF DISCHARGE:  LH                              HISTORY & PHYSICAL   ADMISSION DIAGNOSES:  1. Pregnancy, term, not delivered.  2. Chronic hypertension with superimposed severe preeclampsia.  3. Anemia, chronic.  4. Minimal pregnancy prenatal care.  5. Right-sided back pain, felt possibly due to liver-based pain      (hepatic capsule stretch).   HISTORY OF PRESENT ILLNESS:  This 35 year old female, gravida 7, para 6,  unknown EDC, was admitted for exacerbation of complaints of throat  soreness, difficulty breathing, with the additional finding of right-  sided back pain today after presenting to labor and delivery.  She was  recently hospitalized earlier this week for difficulty with swallowing.  Evaluation at that time was complicated by her challenging, dramatic  presentation.  She had evaluation then, including ultrasound, confirming  that she had an intrauterine pregnancy at term.  Estimated fetal weight  is 7 pounds, 7 ounces.  Her laboratory evaluation included severe anemia  with a hemoglobin of 7.4, hematocrit 22 with hyperchromic microcytic  indices.  She had BUN of 1, creatinine 0.5.  She had normal EGFR of  greater than 60.  She had liver function tests that were normal at an  AST of 31, ALT 11.  Urine drug screen was negative.  Prenatal profile  was obtained.  Urine protein was tested and was negative on admission  and trace protein on two days into the hospitalization.   Additional past medical history includes blood pressures that were noted  while in the hospital as being mildly elevated with blood pressures of  142/81, up to 163/91, with no blood pressures greater than that.  Her  weight was 90 kg on April 03, 2006.   At this time, she presents with the same dramatic presentation of  behaviors before, sitting in bed, eyes closed, rocking back and forth,  complaining of being uncomfortable, and not being able to swallow with  her primary focus being that she cannot swallow.  Nonetheless, she  complained of dramatic back pain on the right side.   Vital signs are noted, show blood pressures in the 172/109, 176/81,  161/104.   Laboratory evaluation today showed that the hemoglobin has increased to  9, hematocrit 27 with enucleated red cells dramatically improved at 11,  likely attributable to her IP iron administered during her recent  hospitalization (see old records).   UNFORTUNATELY, liver function tests are dramatically elevated with an  SGOT of 800, SGPT 335.  Albumin remains low at 2.2.  Total protein low  at 5.9.  Urine protein is not available.  At this time, patient just  refuses to allow [place] to catheterize her and has been unable to give [place] a  voided specimen to date.   PHYSICAL EXAMINATION:  Notable for a term-sized fetus, vertex  presentation.  Right upper quadrant equivocal discomfort.  She gives an  inconsistent statement of a little uncomfortable on the right, but she  also gives the same complaint when the left upper quadrant is palpated.  The back pain shows no CVA tenderness, but she complains of pain,  unresponsive to movement.  The lower extremities show 3+ edema to the  knees, consistent with her prior presentation and reflexes are now  slightly increased at 3+.  She is admitted with a presumed diagnosis of  pregnancy at term, chronic hypertension, superimposed preeclampsia,  severe anemia with her magnesium sulfate administration and Pitocin  induction for probable delivery this hospitalization.   Unfortunately to date, the patient has refused to allow [place] to begin  Pitocin, as she states that she was not going to deliver here, that she   has a plane flight arranged for two days from now to take her to [place],  where she is to deliver and give the baby up for adoption in a pre-  arranged adoption.  We will continue to work with the patient to  convince her of the urgency of the situation and proceed towards  delivery here.      Tilda Burrow, M.D.  Electronically Signed     JVF/MEDQ  D:  04/07/2006  T:  04/07/2006  Job:  [id number]   cc:   Francoise Schaumann. Milford Cage DO, FAAP  Fax: [phone]

## 2010-07-08 NOTE — Op Note (Signed)
NAME:  [name], [name] NO.:  [id number]   MEDICAL RECORD NO.:  [id number]          PATIENT TYPE:  INP   LOCATION:  A413                          FACILITY:  [facility]   PHYSICIAN:  Tilda Burrow, M.D. DATE OF BIRTH:  1975/12/22   DATE OF PROCEDURE:  04/07/2006  DATE OF DISCHARGE:                               OPERATIVE REPORT   PREOPERATIVE DIAGNOSES:  1. Pregnancy at term.  2. Chronic hypertension and superimposed preeclampsia.  3. Uncertain fetal status.  4. Elective sterilization.   POSTOPERATIVE DIAGNOSES:  1. Pregnancy at term.  2. Chronic hypertension and superimposed preeclampsia.  3. Uncertain fetal status.  4. Elective sterilization.   OPERATION/PROCEDURE:  1. Primary low transverse cervical cesarean section.  2. Bilateral partial salpingectomy.   SURGEON:  Tilda Burrow, M.D.   ASSISTANT:  None.   ANESTHESIA:  Spinal by Marylene Buerger, CRNA.   COMPLICATIONS:  Hypotension, status post spinal with gradual response to  fluids and pressor agents, post spinal.   FINDINGS:  Female infant, Apgars 2, 3, __________  , and 9 at 1, 5, 10,  and 15 minutes respectively.  See pediatrics for details.   INDICATIONS:  This 35 year old female was taken to the OR for cesarean  section as the baby developed a nonreassuring fetal heart rate tracing  with  questionable rates even before we considered beginning Pitocin.  Decision was made shortly after 7 p.m.  The patient was taken to the OR  after fluid bolus at 1 L per hour, initiated after a decision and taken  to the OR where cesarean section was performed.  The patient had refused  to allow [place] to insert the Foley on the labor and delivery floor as is  our protocol for urgent deliveries.   DESCRIPTION OF PROCEDURE:  The patient was taken to the operating room  and sat forward.  Spinal anesthesia introduced promptly on the first  attempt and the patient quickly placed supine.  Abdomen was prepped and  draped  and transverse lower abdominal incision was performed. Foley  catheter had to be inserted before the prepping and draping could be  performed.  A transverse incision was performed promptly and a bladder  flap developed on the lower uterine segment and a transverse uterine  incision performed identifying the amniotic sac with clear fluid.  Fundal pressure was applied and the fetal head easily expelled.  The  body was delivered by using axillary traction and delivering the baby.  Bulb suctioning was performed.  Approximately 50 cm of cord was left  attached to the baby and thus the baby was transferred to Dr. Milford Cage for  subsequent care.  See his notes for details.   Placenta was delivered promptly easily with very minimal blood loss.  The uterus closure was easily accomplished with single running locking  closure and then bladder flap reapproximated with running 2-0 chromic.   Tubal ligation was then performed.  We had earlier, after delivery of  the infant, we placed a flexible Vi-Drape,  retractor placed to allow  optimal visualization of the abdomen.  The right tube was elevated,  doubly ligated around the mid segment knuckle of tube, and then the  specimen removed.  The uterus was rotated, the left tube treated in a  similar fashion with a large __segment of_  left tube taken out after  double ligation around the pedicle.  Reinspection of the tubal stumps,  after the procedure, showed some oozing from the veins beneath the right  tube ligation site.  The suture was given a bit of a tug and the suture  was not adequately secure.  Therefore, a distal salpingectomy was  performed on the right side being careful to achieve hemostasis.  We  proximal stump was hemostatic at the end of the case and the entire  distal portion of the right tube removed.  Hemostasis was satisfactory  at this time.  The left tube was inspected and was satisfactorily  hemostatic.   At this time we irrigated the  abdomen, closed the anterior peritoneum  with 2-0 chromic, closed the fascia with running 0 Vicryl.  Subcutaneous  tissues were easily reapproximated with interrupted 2-0 plain.  Staple  closure of the skin completed the procedure.  Estimated blood loss was  300 mL, quite minimal.   ADDENDUM:  In post delivery procedure, discussion with anesthesia,  became aware of the severity of the hypotension which had recurred post  spinal which resulted in the first blood pressure being in the 60s  systolic immediately post spinal, and improved to approximately 110  systolic at the time of delivery of the infant.  This information was  communicated to Dr. Milinda Cave in the nursery.   The patient's blood pressures post procedure recovery were in the 140-  150 systolic.  Magnesium sulfate was continued throughout.      Tilda Burrow, M.D.  Electronically Signed     JVF/MEDQ  D:  04/08/2006  T:  04/08/2006  Job:  [id number]   cc:   Jeoffrey Massed, MD  Fax: [phone]

## 2010-07-08 NOTE — Op Note (Signed)
NAME:  [name], [name] NO.:  [id number]   MEDICAL RECORD NO.:  [id number]          PATIENT TYPE:  INP   LOCATION:  A307                          FACILITY:  [facility]   PHYSICIAN:  Dalia Heading, M.D.  DATE OF BIRTH:  06/25/1975   DATE OF PROCEDURE:  04/16/2006  DATE OF DISCHARGE:                               OPERATIVE REPORT   PREOPERATIVE DIAGNOSIS:  Gallstone pancreatitis.   POSTOPERATIVE DIAGNOSIS:  Gallstone pancreatitis.   PROCEDURE:  Laparoscopic cholecystectomy.   SURGEON:  Dr. Franky Macho   ANESTHESIA:  General endotracheal.   INDICATIONS:  The patient is a 35 year old black female who presents  with gallstone pancreatitis.  Her liver enzyme tests as well as amylase  and lipase have been returning to normal.  Her common bile duct was  measured at 3 mm.  The patient now comes to the operating room for  laparoscopic cholecystectomy.  Risks and benefits of the procedure  including bleeding, infection, hepatobiliary injury, possibly an open  procedure were fully explained to the patient, gave informed consent.   PROCEDURE NOTE:  The patient was placed in supine position.  After  induction of general endotracheal anesthesia, the abdomen was prepped  and draped using the usual sterile technique with Betadine.  Surgical  site confirmation was performed.   A supraumbilical incision was made down to fascia.  Veress needle was  introduced into the abdominal cavity and confirmation of placement was  done using the saline drop test.  The abdomen was then insufflated to 16  mmHg pressure.  An 11-mm trocar was introduced into the abdominal cavity  under direct visualization without difficulty.  The patient was placed  in reversed Trendelenburg position.  Additional 11-mm trocar was placed  in epigastric region and 5-mm trocars were placed in the right upper  quadrant and right flank regions.  Liver was inspected and noted to be  within normal limits.  The  gallbladder was retracted superiorly and  laterally.  Dissection was begun around the infundibulum of the  gallbladder.  The cystic duct was first identified.  Its juncture to the  infundibulum fully identified.  Endoclips placed proximally and distally  on the cystic duct and cystic duct was divided.  This likewise done on  cystic artery.  The gallbladder then was freed away from the gallbladder  fossa using Bovie electrocautery.  The gallbladder delivered through the  epigastric trocar site using EndoCatch bag.  The gallbladder fossa was  inspected.  No abnormal bleeding or bile leakage was noted.  Surgicel  was placed in the gallbladder fossa.  All fluid and air were then  evacuated from the abdominal cavity prior to removal of the trocars.   All wounds were irrigated with normal saline.  All wounds were injected  with 0.5% Sensorcaine.  The supraumbilical fascia was reapproximated  using 0 Vicryl interrupted suture.  All skin incisions were closed using  staples.  Betadine ointment and dry sterile dressings were applied.   All tape and needle counts were correct at the end of procedure.  The  patient was extubated in the  operating room and went back to recovery  room awake in stable condition.  Complications none.   SPECIMEN:  Gallbladder.   BLOOD LOSS:  Minimal.      Dalia Heading, M.D.  Electronically Signed     MAJ/MEDQ  D:  04/16/2006  T:  04/16/2006  Job:  [id number]   cc:   Lazaro Arms, M.D.  Fax: [phone]
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
## 2010-07-08 NOTE — Discharge Summary (Signed)
NAME:  [name], [name] NO.:  [id number]   MEDICAL RECORD NO.:  [id number]          PATIENT TYPE:  OBV   LOCATION:  A210                          FACILITY:  [facility]   PHYSICIAN:  Lonia Blood, M.D.      DATE OF BIRTH:  12/30/1975   DATE OF ADMISSION:  03/06/2005  DATE OF DISCHARGE:  03/08/2005                                 DISCHARGE SUMMARY   PRIMARY CARE PHYSICIAN:  Patient is suppose to follow up with Dr. Loleta Chance,  although she has not seen him yet.   DISCHARGE DIAGNOSES:  1.  Angioedema of unknown etiology.  2.  History of pulmonary embolus with poor treatment compliance.  3.  Chronic anemia of iron deficiency.  4.  Generalized aches with headaches.   DISCHARGE MEDICATIONS:  1.  Prednisone taper 20 mg b.i.d. for two days, then 10 mg b.i.d. for three      days.  2.  Pepcid 20 mg daily for the next five days.  3.  Ferrous sulfate 325 mg daily t.i.d.  4.  Benadryl 25 mg q.6h. p.r.n.  5.  Vicodin 5/500 one to two tablets q.6h.   DISPOSITION:  Patient is improving right now and will be discharged.  She  will follow up with Dr. Loleta Chance as previously scheduled.   PROCEDURE PERFORMED:  1.  Chest x-ray performed on March 06, 2005, shows no acute disease.  2.  Another portable chest performed on March 06, 2005, shows low volume      chest film with vascular carotid and basilar atelectasis, now is      borderline heart size.   CONSULTATIONS:  None.   BRIEF HISTORY AND PHYSICAL:  Please refer to the dictated history and  physical by Dr. Roxan Hockey .  Patient is a 35 year old African American female  who was previously admitted in August 2006, with acute pulmonary embolus.  She was on Coumadin and Lovenox.  Patient only took Coumadin for one and a  half months and then stopped on her own.  She came in essentially with  swollen face.  She also had some hurt feelings all over.  She denied any new  Medications.  Denied any new exposure.  She did have some weeping when  she  came in.  No dysphagia.   She did have PENICILLIN  allergy, but that is about it.   She was subsequently admitted for management of her acute angioedema.   HOSPITAL COURSE:  PROBLEM #1 -  ANGIOEDEMA:  The cause of her angioedema is  currently unknown.  Work-up included induced versus genetic causes.  She has  work-up performed.  So far sed rate is negative.  Compliments are currently  pending.  Homocystine level is negative.  She has immunoglobulins also  pending.  Hepatitis serologies so far negative.  Cryoglobulins are negative.  Other pending labs at this point include lupus anticoagulant.  Antithrombin  III is also positive.  C1 esterase inhibitor is also pending together with  SPEP. Patient has, however, improved tremendously at this point. She has  done well on steroids, Pepcid, Benadryl.  Will  discharge her home to  complete her treatment.  If any of this lab work came back positive  indicating any genetic cause, then patient will be informed and further  treatment will be instituted as necessary.  Since this is her first episode,  she must have been reactive to something in the environment or something she  could expose to including insect bite as she could not remember.   PROBLEM #2 -  HISTORY OF PULMONARY EMBOLUS:  Decision as to whether or not  to resume Coumadin was made.  However, patient did have what appeared to be  a triggered pulmonary embolus before.  She is asymptomatic now and she is  doing fine.  She is noncompliant.  She is not willing to take Coumadin  again, so anticoagulation was not restarted.   PROBLEM #3 -  CHRONIC ANEMIA:  Patient's anemia was deemed to be iron-  deficiency.  She had a recent childbirth as well which may have contributed.  She has been started on iron therapy.  Now her hemoglobin has dropped some  with hemodilution but is still not critical.   PROBLEM #4 -  MORBID OBESITY:  Patient has been counseled.      Lonia Blood, M.D.   Electronically Signed     LG/MEDQ  D:  03/08/2005  T:  03/09/2005  Job:  [id number]
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
## 2010-11-16 LAB — CBC
Platelets: 285
RBC: 3.87
WBC: 6.1

## 2010-11-16 LAB — DIFFERENTIAL
Basophils Absolute: 0
Basophils Relative: 0
Eosinophils Absolute: 0.1
Eosinophils Relative: 1
Monocytes Absolute: 0.4

## 2010-11-16 LAB — COMPREHENSIVE METABOLIC PANEL
ALT: 12
AST: 16
Albumin: 3.6
Alkaline Phosphatase: 83
CO2: 28
Chloride: 105
GFR calc Af Amer: >60
Potassium: 3.7
Total Bilirubin: 0.3

## 2010-11-16 LAB — URINALYSIS, ROUTINE W REFLEX MICROSCOPIC
Bilirubin Urine: NEGATIVE
Glucose, UA: NEGATIVE
Protein, ur: NEGATIVE
Specific Gravity, Urine: 1.025

## 2010-11-16 LAB — URINE MICROSCOPIC-ADD ON

## 2011-08-06 ENCOUNTER — Encounter (HOSPITAL_COMMUNITY): Payer: Self-pay

## 2011-08-06 ENCOUNTER — Emergency Department (HOSPITAL_COMMUNITY)
Admission: EM | Admit: 2011-08-06 | Discharge: 2011-08-07 | Disposition: A | Discharge status: Home / Self Care | Payer: Self-pay | Attending: Emergency Medicine | Admitting: Emergency Medicine

## 2011-08-06 DIAGNOSIS — F17200 Nicotine dependence, unspecified, uncomplicated: Secondary | ICD-10-CM | POA: Insufficient documentation

## 2011-08-06 DIAGNOSIS — K029 Dental caries, unspecified: Secondary | ICD-10-CM | POA: Insufficient documentation

## 2011-08-06 DIAGNOSIS — M545 Low back pain, unspecified: Secondary | ICD-10-CM | POA: Insufficient documentation

## 2011-08-06 DIAGNOSIS — K0889 Other specified disorders of teeth and supporting structures: Secondary | ICD-10-CM

## 2011-08-06 DIAGNOSIS — G8929 Other chronic pain: Secondary | ICD-10-CM

## 2011-08-06 NOTE — ED Notes (Signed)
Pt reporting toothache on left side for several days. Also reporting pain in head, ear and jaw.  Pt reporting back pain as well.

## 2011-08-06 NOTE — ED Notes (Signed)
Tooth hurts, gum is swollen, my back hurts, my head hurts, and I keep throwing up per pt.

## 2011-08-07 MED ORDER — CLINDAMYCIN HCL 300 MG PO CAPS: ORAL_CAPSULE | ORAL | Status: DC

## 2011-08-07 MED ORDER — PROMETHAZINE HCL 25 MG PO TABS: 25.0000 mg | ORAL_TABLET | Freq: Four times a day (QID) | ORAL | Status: DC | PRN

## 2011-08-07 MED ORDER — OXYCODONE-ACETAMINOPHEN 5-325 MG PO TABS: 1.0000 | ORAL_TABLET | ORAL | Status: AC | PRN

## 2011-08-07 MED ORDER — OXYCODONE-ACETAMINOPHEN 5-325 MG PO TABS
1.0000 | ORAL_TABLET | Freq: Once | ORAL | Status: AC
  Administered 2011-08-07: 1 via ORAL
  Filled 2011-08-07: qty 1

## 2011-08-07 MED ORDER — CLINDAMYCIN HCL 150 MG PO CAPS
300.0000 mg | ORAL_CAPSULE | Freq: Once | ORAL | Status: AC
  Administered 2011-08-07: 300 mg via ORAL
  Filled 2011-08-07: qty 2

## 2011-08-07 NOTE — ED Provider Notes (Signed)
History     CSN: [id number]  Arrival date & time 08/06/11  2238   First MD Initiated Contact with Patient 08/06/11 2327      Chief Complaint  Patient presents with  . Dental Pain  . Back Pain    (Consider location/radiation/quality/duration/timing/severity/associated sxs/prior treatment) Patient is a 36 y.o. female presenting with tooth pain and back pain. The history is provided by the patient.  Dental PainThe primary symptoms include mouth pain. Primary symptoms do not include dental injury, oral bleeding, headaches, fever, shortness of breath, sore throat, angioedema or cough. The symptoms are worsening. The symptoms are new. The symptoms occur constantly.  Mouth pain began 24 -48 hours ago. Mouth pain occurs constantly. Mouth pain is worsening. Affected locations include: teeth and gum(s).  Additional symptoms include: dental sensitivity to temperature and gum tenderness. Additional symptoms do not include: gum swelling, trismus, jaw pain, facial swelling, trouble swallowing, ear pain and swollen glands. Medical issues include: smoking and periodontal disease.   Back Pain  This is a recurrent problem. The current episode started more than 2 days ago. The problem occurs constantly. The problem has not changed since onset.The pain is associated with twisting. The pain is present in the lumbar spine. The quality of the pain is described as aching. The pain does not radiate. The pain is mild. The symptoms are aggravated by bending, twisting and certain positions. Pertinent negatives include no chest pain, no fever, no headaches, no abdominal pain, no abdominal swelling, no bowel incontinence, no perianal numbness, no bladder incontinence, no dysuria, no pelvic pain, no leg pain, no paresthesias, no paresis, no tingling and no weakness. She has tried nothing for the symptoms. The treatment provided no relief.    History reviewed. No pertinent past medical history.  History reviewed. No  pertinent past surgical history.  History reviewed. No pertinent family history.  History  Substance Use Topics  . Smoking status: Current Everyday Smoker -- 1.0 packs/day  . Smokeless tobacco: Not on file  . Alcohol Use: No    OB History    Grav Para Term Preterm Abortions TAB SAB Ect Mult Living                  Review of Systems  Constitutional: Negative for fever and appetite change.  HENT: Positive for dental problem. Negative for ear pain, congestion, sore throat, facial swelling, trouble swallowing, neck pain and neck stiffness.   Eyes: Negative for pain and visual disturbance.  Respiratory: Negative for cough and shortness of breath.   Cardiovascular: Negative for chest pain.  Gastrointestinal: Negative for abdominal pain and bowel incontinence.  Genitourinary: Negative for bladder incontinence, dysuria, hematuria, flank pain, difficulty urinating and pelvic pain.  Musculoskeletal: Positive for back pain. Negative for joint swelling.  Skin: Negative.   Neurological: Negative for dizziness, tingling, facial asymmetry, weakness, headaches and paresthesias.  Hematological: Negative for adenopathy.  All other systems reviewed and are negative.    Allergies  Penicillins  Home Medications  No current outpatient prescriptions on file.  BP 142/86  Pulse 67  Temp 98.3 F (36.8 C) (Oral)  Resp 18  Ht 5\' 5"  (1.651 m)  Wt 170 lb (77.111 kg)  BMI 28.29 kg/m2  SpO2 100%  LMP 08/06/2011  Physical Exam  Nursing note and vitals reviewed. Constitutional: She is oriented to person, place, and time. She appears well-developed and well-nourished. No distress.  HENT:  Head: Normocephalic and atraumatic. No trismus in the jaw.  Mouth/Throat: Uvula is midline,  oropharynx is clear and moist and mucous membranes are normal. Dental caries present. No dental abscesses or uvula swelling.    Neck: Normal range of motion. Neck supple.  Cardiovascular: Normal rate, regular rhythm  and intact distal pulses.   No murmur heard. Pulmonary/Chest: Effort normal and breath sounds normal.  Musculoskeletal: She exhibits tenderness. She exhibits no edema.       Lumbar back: She exhibits tenderness and pain. She exhibits normal range of motion, no swelling, no deformity, no laceration and normal pulse.       Back:  Neurological: She is alert and oriented to person, place, and time. No cranial nerve deficit or sensory deficit. She exhibits normal muscle tone. Coordination and gait normal.  Reflex Scores:      Patellar reflexes are 2+ on the right side and 2+ on the left side.      Achilles reflexes are 2+ on the right side and 2+ on the left side. Skin: Skin is warm and dry.    ED Course  Procedures (including critical care time)  Labs Reviewed - No data to display     MDM    Multiple dental caries of the left upper and lower molars.  No facial edema, dental abscess or trismus. Patient also has ttp of the lumbar paraspinal muscles.  Dp pulses are brisk and equal, distal sensation intact.  No focal neuro deficits on exam  , ambulates with a steady gait.    Patient / Family / Caregiver understand and agree with initial ED impression and plan with expectations set for ED visit. Pt stable in ED with no significant deterioration in condition. Pt feels improved after observation and/or treatment in ED.    Prescribed:  clindaymycin Phenergan Percocet #15   Val Schiavo L. [place], [place] 08/10/11 1909

## 2011-08-07 NOTE — Discharge Instructions (Signed)
Chronic Back Pain When back pain lasts longer than 3 months, it is called chronic back pain.This pain can be frustrating, but the cause of the pain is rarely dangerous.People with chronic back pain often go through certain periods that are more intense (flare-ups). CAUSES Chronic back pain can be caused by wear and tear (degeneration) on different structures in your back. These structures may include bones, ligaments, or discs. This degeneration may result in more pressure being placed on the nerves that travel to your legs and feet. This can lead to pain traveling from the low back down the back of the legs. When pain lasts longer than 3 months, it is not unusual for people to experience anxiety or depression. Anxiety and depression can also contribute to low back pain. TREATMENT  Establish a regular exercise plan. This is critical to improving your functional level.   Have a self-management plan for when you flare-up. Flare-ups rarely require a medical visit. Regular exercise will help reduce the intensity and frequency of your flare-ups.   Manage how you feel about your back pain and the rest of your life. Anxiety, depression, and feeling that you cannot alter your back pain have been shown to make back pain more intense and debilitating.   Medicines should never be your only treatment. They should be used along with other treatments to help you return to a more active lifestyle.   Procedures such as injections or surgery may be helpful but are rarely necessary. You may be able to get the same results with physical therapy or chiropractic care.  HOME CARE INSTRUCTIONS  Avoid bending, heavy lifting, prolonged sitting, and activities which make the problem worse.   Continue normal activity as much as possible.   Take brief periods of rest throughout the day to reduce your pain during flare-ups.   Follow your back exercise rehabilitation program. This can help reduce symptoms and prevent  more pain.   Only take over-the-counter or prescription medicines as directed by your caregiver. Muscle relaxants are sometimes prescribed. Narcotic pain medicine is discouraged for long-term pain, since addiction is a possible outcome.   If you smoke, quit.   Eat healthy foods and maintain a recommended body weight.  SEEK IMMEDIATE MEDICAL CARE IF:   You have weakness or numbness in one of your legs or feet.   You have trouble controlling your bladder or bowels.   You develop nausea, vomiting, abdominal pain, shortness of breath, or fainting.  Document Released: 03/16/2004 Document Revised: 01/26/2011 Document Reviewed: 01/21/2011 [facility] Patient Information 2012 [place], [place].Dental Pain A tooth ache may be caused by cavities (tooth decay). Cavities expose the nerve of the tooth to air and hot or cold temperatures. It may come from an infection or abscess (also called a boil or furuncle) around your tooth. It is also often caused by dental caries (tooth decay). This causes the pain you are having. DIAGNOSIS  Your caregiver can diagnose this problem by exam. TREATMENT   If caused by an infection, it may be treated with medications which kill germs (antibiotics) and pain medications as prescribed by your caregiver. Take medications as directed.   Only take over-the-counter or prescription medicines for pain, discomfort, or fever as directed by your caregiver.   Whether the tooth ache today is caused by infection or dental disease, you should see your dentist as soon as possible for further care.  SEEK MEDICAL CARE IF: The exam and treatment you received today has been provided on an emergency  basis only. This is not a substitute for complete medical or dental care. If your problem worsens or new problems (symptoms) appear, and you are unable to meet with your dentist, call or return to this location. SEEK IMMEDIATE MEDICAL CARE IF:   You have a fever.   You develop redness and  swelling of your face, jaw, or neck.   You are unable to open your mouth.   You have severe pain uncontrolled by pain medicine.  MAKE SURE YOU:   Understand these instructions.   Will watch your condition.   Will get help right away if you are not doing well or get worse.  Document Released: 02/06/2005 Document Revised: 01/26/2011 Document Reviewed: 09/25/2007 [facility] Patient Information 2012 [place], [place].

## 2011-08-14 NOTE — ED Provider Notes (Signed)
Medical screening examination/treatment/procedure(s) were performed by non-physician practitioner and as supervising physician I was immediately available for consultation/collaboration.  Nicoletta Dress. Colon Branch, MD 08/14/11 [phone]

## 2012-03-31 ENCOUNTER — Encounter (HOSPITAL_COMMUNITY): Payer: Self-pay

## 2012-03-31 ENCOUNTER — Emergency Department (HOSPITAL_COMMUNITY)
Admission: EM | Admit: 2012-03-31 | Discharge: 2012-03-31 | Disposition: A | Discharge status: Home / Self Care | Payer: Self-pay | Attending: Emergency Medicine | Admitting: Emergency Medicine

## 2012-03-31 DIAGNOSIS — K0889 Other specified disorders of teeth and supporting structures: Secondary | ICD-10-CM

## 2012-03-31 DIAGNOSIS — R6884 Jaw pain: Secondary | ICD-10-CM | POA: Insufficient documentation

## 2012-03-31 DIAGNOSIS — K029 Dental caries, unspecified: Secondary | ICD-10-CM | POA: Insufficient documentation

## 2012-03-31 DIAGNOSIS — R51 Headache: Secondary | ICD-10-CM | POA: Insufficient documentation

## 2012-03-31 DIAGNOSIS — F17200 Nicotine dependence, unspecified, uncomplicated: Secondary | ICD-10-CM | POA: Insufficient documentation

## 2012-03-31 MED ORDER — ACETAMINOPHEN-CODEINE #3 300-30 MG PO TABS
2.0000 | ORAL_TABLET | Freq: Once | ORAL | Status: AC
  Administered 2012-03-31: 2 via ORAL
  Filled 2012-03-31: qty 2

## 2012-03-31 MED ORDER — ACETAMINOPHEN-CODEINE #3 300-30 MG PO TABS: 1.0000 | ORAL_TABLET | Freq: Four times a day (QID) | ORAL | Status: DC | PRN

## 2012-03-31 MED ORDER — IBUPROFEN 800 MG PO TABS
800.0000 mg | ORAL_TABLET | Freq: Once | ORAL | Status: AC
  Administered 2012-03-31: 800 mg via ORAL
  Filled 2012-03-31: qty 1

## 2012-03-31 MED ORDER — CLINDAMYCIN HCL 150 MG PO CAPS
300.0000 mg | ORAL_CAPSULE | Freq: Once | ORAL | Status: AC
  Administered 2012-03-31: 300 mg via ORAL
  Filled 2012-03-31: qty 1
  Filled 2012-03-31: qty 2

## 2012-03-31 MED ORDER — PROMETHAZINE HCL 12.5 MG PO TABS
12.5000 mg | ORAL_TABLET | Freq: Once | ORAL | Status: AC
  Administered 2012-03-31: 12.5 mg via ORAL
  Filled 2012-03-31: qty 1

## 2012-03-31 MED ORDER — CLINDAMYCIN HCL 150 MG PO CAPS: 150.0000 mg | ORAL_CAPSULE | Freq: Three times a day (TID) | ORAL | Status: DC

## 2012-03-31 NOTE — ED Notes (Signed)
Tooth pain and gum swelling for 2 days.

## 2012-03-31 NOTE — ED Provider Notes (Signed)
History     CSN: [id number]  Arrival date & time 03/31/12  1430   First MD Initiated Contact with Patient 03/31/12 1534      Chief Complaint  Patient presents with  . Dental Pain    (Consider location/radiation/quality/duration/timing/severity/associated sxs/prior treatment) Patient is a 37 y.o. female presenting with tooth pain. The history is provided by the patient.  Dental PainThe primary symptoms include mouth pain and headaches. Primary symptoms do not include shortness of breath or cough. The symptoms began 2 days ago. The symptoms are worsening.  The headache is not associated with photophobia.  Additional symptoms include: gum swelling and jaw pain. Additional symptoms do not include: pain with swallowing, drooling and nosebleeds. Medical issues include: smoking.    History reviewed. No pertinent past medical history.  Past Surgical History  Procedure Laterality Date  . Cholecystectomy    . Tubal ligation      No family history on file.  History  Substance Use Topics  . Smoking status: Current Every Day Smoker -- 1.00 packs/day    Types: Cigarettes  . Smokeless tobacco: Not on file  . Alcohol Use: No    OB History   Grav Para Term Preterm Abortions TAB SAB Ect Mult Living                  Review of Systems  Constitutional: Negative for activity change.       All ROS Neg except as noted in HPI  HENT: Positive for dental problem. Negative for nosebleeds, drooling and neck pain.   Eyes: Negative for photophobia and discharge.  Respiratory: Negative for cough, shortness of breath and wheezing.   Cardiovascular: Negative for chest pain and palpitations.  Gastrointestinal: Negative for abdominal pain and blood in stool.  Genitourinary: Negative for dysuria, frequency and hematuria.  Musculoskeletal: Negative for back pain and arthralgias.  Skin: Negative.   Neurological: Positive for headaches. Negative for dizziness, seizures and speech difficulty.    Psychiatric/Behavioral: Negative for hallucinations and confusion.    Allergies  Penicillins  Home Medications   Current Outpatient Rx  Name  Route  Sig  Dispense  Refill  . EXPIRED: promethazine (PHENERGAN) 25 MG tablet   Oral   Take 1 tablet (25 mg total) by mouth every 6 (six) hours as needed for nausea.   8 tablet   0     BP 167/95  Pulse 119  Temp(Src) 98.4 F (36.9 C) (Oral)  Resp 20  Ht 5\' 6"  (1.676 m)  Wt 176 lb 6 oz (80.003 kg)  BMI 28.48 kg/m2  SpO2 100%  LMP 03/31/2012  Physical Exam  Nursing note and vitals reviewed. Constitutional: She is oriented to person, place, and time. She appears well-developed and well-nourished.  Non-toxic appearance.  HENT:  Head: Normocephalic.  Right Ear: Tympanic membrane and external ear normal.  Left Ear: Tympanic membrane and external ear normal.  Mouth/Throat:    Airway patent. No swelling under the tongue. No facial swelling present.  Eyes: EOM and lids are normal. Pupils are equal, round, and reactive to light.  Neck: Normal range of motion. Neck supple. Carotid bruit is not present.  Cardiovascular: Regular rhythm, normal heart sounds, intact distal pulses and normal pulses.  Tachycardia present.   Pulmonary/Chest: Breath sounds normal. No respiratory distress.  Abdominal: Soft. Bowel sounds are normal. There is no tenderness. There is no guarding.  Musculoskeletal: Normal range of motion.  Lymphadenopathy:       Head (right side): No submandibular  adenopathy present.       Head (left side): No submandibular adenopathy present.    She has no cervical adenopathy.  Neurological: She is alert and oriented to person, place, and time. She has normal strength. No cranial nerve deficit or sensory deficit.  Skin: Skin is warm and dry.  Psychiatric: Her speech is normal. Her mood appears anxious.    ED Course  Procedures (including critical care time)  Labs Reviewed - No data to display No results found.   No  diagnosis found.    MDM  I have reviewed nursing notes, vital signs, and all appropriate lab and imaging results for this patient. This is one of several visits for this patient concerning her dentition. The patient states that this episode of pain began approximately 2 days ago. He states that she cannot find a comfortable place to rest. She has tried over-the-counter medications and these are not working for her. The patient has yet to see a dentist concerning the multiple cavities and dental issues that she currently hands.  Discuss with patient the importance of her seeing a dentist and the risk of additional infections. The patient will receive a prescription for clindamycin 150 mg 3 times daily, Mobic daily, and Tylenol with codeine #3,1 or 2 every 6 hours as needed for pain #20.       Kathie Dike, [place] 03/31/12 1701

## 2012-03-31 NOTE — ED Notes (Signed)
Pt called at 2:45 for triage no answer

## 2012-04-01 NOTE — ED Provider Notes (Signed)
Medical screening examination/treatment/procedure(s) were performed by non-physician practitioner and as supervising physician I was immediately available for consultation/collaboration.   Laray Anger, DO 04/01/12 2142

## 2012-08-27 ENCOUNTER — Encounter (HOSPITAL_COMMUNITY): Payer: Self-pay

## 2012-08-27 ENCOUNTER — Emergency Department (HOSPITAL_COMMUNITY)
Admission: EM | Admit: 2012-08-27 | Discharge: 2012-08-28 | Disposition: A | Discharge status: Home / Self Care | Payer: Self-pay | Attending: Emergency Medicine | Admitting: Emergency Medicine

## 2012-08-27 DIAGNOSIS — R110 Nausea: Secondary | ICD-10-CM | POA: Insufficient documentation

## 2012-08-27 DIAGNOSIS — R61 Generalized hyperhidrosis: Secondary | ICD-10-CM | POA: Insufficient documentation

## 2012-08-27 DIAGNOSIS — H53149 Visual discomfort, unspecified: Secondary | ICD-10-CM | POA: Insufficient documentation

## 2012-08-27 DIAGNOSIS — B9789 Other viral agents as the cause of diseases classified elsewhere: Secondary | ICD-10-CM | POA: Insufficient documentation

## 2012-08-27 DIAGNOSIS — R509 Fever, unspecified: Secondary | ICD-10-CM | POA: Insufficient documentation

## 2012-08-27 DIAGNOSIS — F17200 Nicotine dependence, unspecified, uncomplicated: Secondary | ICD-10-CM | POA: Insufficient documentation

## 2012-08-27 DIAGNOSIS — B349 Viral infection, unspecified: Secondary | ICD-10-CM

## 2012-08-27 DIAGNOSIS — Z880 Allergy status to penicillin: Secondary | ICD-10-CM | POA: Insufficient documentation

## 2012-08-27 LAB — CBC WITH DIFFERENTIAL/PLATELET
Basophils Relative: 0 % (ref 0–1)
Eosinophils Absolute: 0 10*3/uL (ref 0.0–0.7)
Hemoglobin: 9.6 g/dL — ABNORMAL LOW (ref 12.0–15.0)
MCH: 26.2 pg (ref 26.0–34.0)
MCHC: 32.4 g/dL (ref 30.0–36.0)
Monocytes Relative: 6 % (ref 3–12)
Neutrophils Relative %: 79 % — ABNORMAL HIGH (ref 43–77)
Platelets: 264 10*3/uL (ref 150–400)
RDW: 16.1 % — ABNORMAL HIGH (ref 11.5–15.5)

## 2012-08-27 LAB — BASIC METABOLIC PANEL
BUN: 8 mg/dL (ref 6–23)
Creatinine, Ser: 0.73 mg/dL (ref 0.50–1.10)
GFR calc Af Amer: >90 mL/min (ref >90)
GFR calc non Af Amer: >90 mL/min (ref >90)
Potassium: 3 mEq/L — ABNORMAL LOW (ref 3.5–5.1)

## 2012-08-27 MED ORDER — PROMETHAZINE HCL 25 MG PO TABS: 25.0000 mg | ORAL_TABLET | Freq: Four times a day (QID) | ORAL | Status: DC | PRN

## 2012-08-27 MED ORDER — KETOROLAC TROMETHAMINE 30 MG/ML IJ SOLN
30.0000 mg | Freq: Once | INTRAMUSCULAR | Status: AC
  Administered 2012-08-27: 30 mg via INTRAVENOUS
  Filled 2012-08-27: qty 1

## 2012-08-27 MED ORDER — ONDANSETRON HCL 4 MG/2ML IJ SOLN
4.0000 mg | Freq: Once | INTRAMUSCULAR | Status: AC
  Administered 2012-08-27: 4 mg via INTRAVENOUS
  Filled 2012-08-27: qty 2

## 2012-08-27 MED ORDER — SODIUM CHLORIDE 0.9 % IV BOLUS (SEPSIS)
1000.0000 mL | Freq: Once | INTRAVENOUS | Status: AC
  Administered 2012-08-27: 1000 mL via INTRAVENOUS

## 2012-08-27 MED ORDER — HYDROCODONE-ACETAMINOPHEN 5-325 MG PO TABS: 1.0000 | ORAL_TABLET | Freq: Four times a day (QID) | ORAL | Status: DC | PRN

## 2012-08-27 MED ORDER — HYDROMORPHONE HCL PF 1 MG/ML IJ SOLN
1.0000 mg | Freq: Once | INTRAMUSCULAR | Status: AC
  Administered 2012-08-27: 1 mg via INTRAVENOUS
  Filled 2012-08-27: qty 1

## 2012-08-27 MED ORDER — POTASSIUM CHLORIDE CRYS ER 20 MEQ PO TBCR
40.0000 meq | EXTENDED_RELEASE_TABLET | Freq: Once | ORAL | Status: AC
  Administered 2012-08-27: 40 meq via ORAL
  Filled 2012-08-27: qty 2

## 2012-08-27 NOTE — ED Notes (Addendum)
Onset generalized headache with photophobia for 3 days. Denies ever having a headache like this before. Also has nausea and heavier than usual menses

## 2012-08-27 NOTE — ED Notes (Signed)
Pt reports she attempted to obtain urine sample and was unable.

## 2012-08-27 NOTE — ED Provider Notes (Signed)
History  This chart was scribed for Benny Lennert, MD, by Candelaria Stagers, ED Scribe. This patient was seen in room [facility] and the patient's care was started at 9:37 PM  CSN: [id number] Arrival date & time 08/27/12  2028  First MD Initiated Contact with Patient 08/27/12 2133     Chief Complaint  Patient presents with  . Headache    Patient is a 37 y.o. female presenting with headaches. The history is provided by the patient. No language interpreter was used.  Headache Pain location:  Frontal Quality:  Unable to specify Radiates to:  Does not radiate Onset quality:  Gradual Duration:  3 days Timing:  Constant Progression:  Unchanged Chronicity:  New Similar to prior headaches: no   Relieved by:  Nothing Worsened by:  Nothing tried Ineffective treatments:  None tried Associated symptoms: fever and photophobia    HPI Comments: [name] is a 37 y.o. female who presents to the Emergency Department complaining of a frontal headache that started three days ago.  SHe is also experiencing diaphoresis, chills, nausea, fever, and photophobia.  Her fever at home has been 101.  Her fever in the ED is 99.1.  Pt denies sore throat or congestion.  She has taken nothing for the pain.    History reviewed. No pertinent past medical history. Past Surgical History  Procedure Laterality Date  . Cholecystectomy    . Tubal ligation     History reviewed. No pertinent family history. History  Substance Use Topics  . Smoking status: Current Every Day Smoker -- 1.00 packs/day    Types: Cigarettes  . Smokeless tobacco: Not on file  . Alcohol Use: No   OB History   Grav Para Term Preterm Abortions TAB SAB Ect Mult Living                 Review of Systems  Constitutional: Positive for fever, chills and diaphoresis.  Eyes: Positive for photophobia.  Neurological: Positive for headaches.  All other systems reviewed and are negative.    Allergies  Penicillins  Home  Medications   Current Outpatient Rx  Name  Route  Sig  Dispense  Refill  . naproxen sodium (ALEVE) 220 MG tablet   Oral   Take 220-440 mg by mouth 3 (three) times daily as needed (for pain).          BP 110/78  Pulse 116  Temp(Src) 99.1 F (37.3 C) (Oral)  Resp 16  Ht 5\' 4"  (1.626 m)  Wt 160 lb (72.576 kg)  BMI 27.45 kg/m2  SpO2 100%  LMP 08/24/2012 Physical Exam  Nursing note and vitals reviewed. Constitutional: She is oriented to person, place, and time. She appears well-developed and well-nourished. No distress.  HENT:  Head: Normocephalic and atraumatic.  Eyes: EOM are normal.  Neck: Neck supple. No tracheal deviation present.  Cardiovascular: Normal rate.   Pulmonary/Chest: Effort normal. No respiratory distress.  Musculoskeletal: Normal range of motion.  Neurological: She is alert and oriented to person, place, and time.  Skin: Skin is warm and dry.  Psychiatric: She has a normal mood and affect. Her behavior is normal.    ED Course  Procedures   DIAGNOSTIC STUDIES: Oxygen Saturation is 100% on room air, normal by my interpretation.    COORDINATION OF CARE:  9:39 PM Discussed course of care with pt.  Pt understands and agrees.   Labs Reviewed  CBC WITH DIFFERENTIAL - Abnormal; Notable for the following:  RBC 3.66 (*)    Hemoglobin 9.6 (*)    HCT 29.6 (*)    RDW 16.1 (*)    Neutrophils Relative % 79 (*)    All other components within normal limits  BASIC METABOLIC PANEL - Abnormal; Notable for the following:    Sodium 134 (*)    Potassium 3.0 (*)    Glucose, Bld 120 (*)    All other components within normal limits  URINALYSIS, ROUTINE W REFLEX MICROSCOPIC   No results found. No diagnosis found.  MDM   The chart was scribed for me under my direct supervision.  I personally performed the history, physical, and medical decision making and all procedures in the evaluation of this patient.Benny Lennert, MD 08/27/12 [phone]

## 2013-05-22 ENCOUNTER — Emergency Department (HOSPITAL_COMMUNITY)
Admission: EM | Admit: 2013-05-22 | Discharge: 2013-05-22 | Disposition: A | Discharge status: Home / Self Care | Payer: Self-pay | Attending: Emergency Medicine | Admitting: Emergency Medicine

## 2013-05-22 DIAGNOSIS — Z880 Allergy status to penicillin: Secondary | ICD-10-CM | POA: Insufficient documentation

## 2013-05-22 DIAGNOSIS — R51 Headache: Secondary | ICD-10-CM | POA: Insufficient documentation

## 2013-05-22 DIAGNOSIS — J029 Acute pharyngitis, unspecified: Secondary | ICD-10-CM | POA: Insufficient documentation

## 2013-05-22 DIAGNOSIS — F17200 Nicotine dependence, unspecified, uncomplicated: Secondary | ICD-10-CM | POA: Insufficient documentation

## 2013-05-22 DIAGNOSIS — R519 Headache, unspecified: Secondary | ICD-10-CM

## 2013-05-22 LAB — RAPID STREP SCREEN (MED CTR MEBANE ONLY): Streptococcus, Group A Screen (Direct): NEGATIVE

## 2013-05-22 MED ORDER — DEXAMETHASONE 1 MG/ML PO CONC: 10.0000 mg | Freq: Once | ORAL | Status: DC

## 2013-05-22 MED ORDER — LIDOCAINE VISCOUS 2 % MT SOLN
15.0000 mL | Freq: Once | OROMUCOSAL | Status: AC
  Administered 2013-05-22: 15 mL via OROMUCOSAL
  Filled 2013-05-22: qty 15

## 2013-05-22 MED ORDER — LIDOCAINE VISCOUS 2 % MT SOLN: 20.0000 mL | OROMUCOSAL | Status: DC | PRN

## 2013-05-22 MED ORDER — KETOROLAC TROMETHAMINE 30 MG/ML IJ SOLN
30.0000 mg | Freq: Once | INTRAMUSCULAR | Status: AC
  Administered 2013-05-22: 30 mg via INTRAMUSCULAR
  Filled 2013-05-22: qty 1

## 2013-05-22 MED ORDER — DEXAMETHASONE 4 MG PO TABS
10.0000 mg | ORAL_TABLET | Freq: Once | ORAL | Status: AC
  Administered 2013-05-22: 10 mg via ORAL
  Filled 2013-05-22: qty 3

## 2013-05-22 MED ORDER — HYDROCODONE-ACETAMINOPHEN 5-325 MG PO TABS: 1.0000 | ORAL_TABLET | Freq: Four times a day (QID) | ORAL | Status: DC | PRN

## 2013-05-22 NOTE — Discharge Instructions (Signed)
As discussed, your evaluation today has been largely reassuring.  But, it is important that you monitor your condition carefully, and do not hesitate to return to the ED if you develop new, or concerning changes in your condition. ? ?Otherwise, please follow-up with your physician for appropriate ongoing care. ? ?

## 2013-05-22 NOTE — ED Provider Notes (Signed)
CSN: [id number]     Arrival date & time 05/22/13  0158 History   First MD Initiated Contact with Patient 05/22/13 [phone]     No chief complaint on file.    (Consider location/radiation/quality/duration/timing/severity/associated sxs/prior Treatment) HPI Patient presents with concern of headache, sore throat.  Onset seems to be one week ago, though it is unclear. Symptoms initially improved, but has become worse over the past 2 days.  Headache is diffuse, sore. There is no associated syncope, chest pain, dyspnea, fever, chills, vomiting, diarrhea. No relief with OTC medication.  No past medical history on file. Past Surgical History  Procedure Laterality Date  . Cholecystectomy    . Tubal ligation     No family history on file. History  Substance Use Topics  . Smoking status: Current Every Day Smoker -- 1.00 packs/day    Types: Cigarettes  . Smokeless tobacco: Not on file  . Alcohol Use: No   OB History   Grav Para Term Preterm Abortions TAB SAB Ect Mult Living                 Review of Systems  Constitutional:       Per HPI, otherwise negative  HENT:       Per HPI, otherwise negative  Respiratory:       Per HPI, otherwise negative  Cardiovascular:       Per HPI, otherwise negative  Gastrointestinal: Negative for vomiting.  Endocrine:       Negative aside from HPI  Genitourinary:       Neg aside from HPI   Musculoskeletal:       Per HPI, otherwise negative  Skin: Negative.   Neurological: Positive for headaches. Negative for syncope and weakness.      Allergies  Penicillins  Home Medications   Current Outpatient Rx  Name  Route  Sig  Dispense  Refill  . HYDROcodone-acetaminophen (NORCO/VICODIN) 5-325 MG per tablet   Oral   Take 1 tablet by mouth every 6 (six) hours as needed for pain.   20 tablet   0   . naproxen sodium (ALEVE) 220 MG tablet   Oral   Take 220-440 mg by mouth 3 (three) times daily as needed (for pain).         . promethazine  (PHENERGAN) 25 MG tablet   Oral   Take 1 tablet (25 mg total) by mouth every 6 (six) hours as needed for nausea.   15 tablet   0    BP 154/100  Pulse 88  Temp(Src) 99.1 F (37.3 C) (Oral)  Resp 16  SpO2 100% Physical Exam  Nursing note and vitals reviewed. Constitutional: She is oriented to person, place, and time. She appears well-developed and well-nourished. No distress.  HENT:  Head: Normocephalic and atraumatic.  Mouth/Throat:    Eyes: Conjunctivae and EOM are normal.  Neck: Normal range of motion. Neck supple.  Cardiovascular: Normal rate and regular rhythm.   Pulmonary/Chest: Effort normal and breath sounds normal. No stridor. No respiratory distress.  Abdominal: She exhibits no distension.  Musculoskeletal: She exhibits no edema.  Neurological: She is alert and oriented to person, place, and time. No cranial nerve deficit.  Skin: Skin is warm and dry.  Psychiatric: She has a normal mood and affect.    ED Course  Procedures (including critical care time) Labs Review Labs Reviewed  RAPID STREP SCREEN   5:03 AM On repeat exam the patient states that she feels better.  She is  speaking more clearly.  No evidence of distress.  Patient was actually sleeping and record awakening for repeat eval. MDM   Final diagnoses:  Pharyngitis  Headache    Patient presents with concerns of ongoing headache, pharyngitis.  Patient is afebrile, with no facial asymmetry, no oral pharyngeal findings other than dry mucous membranes.  However, patient does not hypotensive or tachycardic suggesting overall dehydration. Patient presents initially care, was discharged in stable condition with primary care followup.  Absent distress, there is low suspicion for a cold systemic infection.  With no facial asymmetry, there is low suspicion for deep space infection. Absent fever, exudate, low suspicion for occult strep throat as well.    Gerhard Munch, MD 05/22/13 [phone]

## 2013-05-22 NOTE — ED Notes (Signed)
Headache and sore throat x 2 days, states she can't eat due to same.

## 2013-05-23 ENCOUNTER — Emergency Department (HOSPITAL_COMMUNITY): Payer: Self-pay

## 2013-05-23 ENCOUNTER — Emergency Department (HOSPITAL_COMMUNITY)
Admission: EM | Admit: 2013-05-23 | Discharge: 2013-05-23 | Disposition: A | Discharge status: Home / Self Care | Payer: Self-pay | Attending: Emergency Medicine | Admitting: Emergency Medicine

## 2013-05-23 ENCOUNTER — Encounter (HOSPITAL_COMMUNITY): Payer: Self-pay | Admitting: Emergency Medicine

## 2013-05-23 DIAGNOSIS — J029 Acute pharyngitis, unspecified: Secondary | ICD-10-CM | POA: Insufficient documentation

## 2013-05-23 DIAGNOSIS — J36 Peritonsillar abscess: Secondary | ICD-10-CM

## 2013-05-23 MED ORDER — FENTANYL CITRATE 0.05 MG/ML IJ SOLN
100.0000 ug | INTRAMUSCULAR | Status: DC | PRN
  Administered 2013-05-23 (×3): 100 ug via INTRAVENOUS
  Filled 2013-05-23 (×3): qty 2

## 2013-05-23 MED ORDER — MORPHINE SULFATE 4 MG/ML IJ SOLN
4.0000 mg | Freq: Once | INTRAMUSCULAR | Status: AC
  Administered 2013-05-23: 4 mg via INTRAVENOUS

## 2013-05-23 MED ORDER — IOHEXOL 300 MG/ML  SOLN
75.0000 mL | Freq: Once | INTRAMUSCULAR | Status: AC | PRN
  Administered 2013-05-23: 75 mL via INTRAVENOUS

## 2013-05-23 MED ORDER — GI COCKTAIL ~~LOC~~
ORAL | Status: AC
  Filled 2013-05-23: qty 30

## 2013-05-23 MED ORDER — MORPHINE SULFATE 4 MG/ML IJ SOLN
4.0000 mg | Freq: Once | INTRAMUSCULAR | Status: AC
  Administered 2013-05-23: 4 mg via INTRAVENOUS
  Filled 2013-05-23: qty 1

## 2013-05-23 MED ORDER — CLINDAMYCIN HCL 300 MG PO CAPS: 300.0000 mg | ORAL_CAPSULE | Freq: Three times a day (TID) | ORAL | Status: DC

## 2013-05-23 MED ORDER — DEXAMETHASONE SODIUM PHOSPHATE 10 MG/ML IJ SOLN
10.0000 mg | Freq: Once | INTRAMUSCULAR | Status: AC
  Administered 2013-05-23: 10 mg via INTRAVENOUS
  Filled 2013-05-23: qty 1

## 2013-05-23 MED ORDER — SODIUM CHLORIDE 0.9 % IV BOLUS (SEPSIS)
1000.0000 mL | Freq: Once | INTRAVENOUS | Status: AC
  Administered 2013-05-23: 1000 mL via INTRAVENOUS

## 2013-05-23 MED ORDER — PROMETHAZINE HCL 12.5 MG PO TABS: 12.5000 mg | ORAL_TABLET | Freq: Four times a day (QID) | ORAL | Status: DC | PRN

## 2013-05-23 MED ORDER — ONDANSETRON HCL 4 MG/2ML IJ SOLN: 4.0000 mg | Freq: Once | INTRAMUSCULAR | Status: DC

## 2013-05-23 MED ORDER — LIDOCAINE VISCOUS 2 % MT SOLN
15.0000 mL | Freq: Once | OROMUCOSAL | Status: DC
Start: 2013-05-23 — End: 2013-05-23
  Filled 2013-05-23: qty 15

## 2013-05-23 MED ORDER — ONDANSETRON HCL 4 MG/2ML IJ SOLN: 4.0000 mg | Freq: Once | INTRAMUSCULAR | Status: AC

## 2013-05-23 MED ORDER — CLINDAMYCIN PHOSPHATE 900 MG/50ML IV SOLN
900.0000 mg | Freq: Once | INTRAVENOUS | Status: AC
  Administered 2013-05-23: 900 mg via INTRAVENOUS
  Filled 2013-05-23: qty 50

## 2013-05-23 MED ORDER — ONDANSETRON HCL 4 MG/2ML IJ SOLN
INTRAMUSCULAR | Status: AC
  Administered 2013-05-23: 4 mg via INTRAVENOUS
  Filled 2013-05-23: qty 2

## 2013-05-23 MED ORDER — GI COCKTAIL ~~LOC~~
30.0000 mL | Freq: Once | ORAL | Status: AC
  Administered 2013-05-23: 30 mL via ORAL

## 2013-05-23 MED ORDER — OXYCODONE-ACETAMINOPHEN 5-325 MG PO TABS: 1.0000 | ORAL_TABLET | Freq: Four times a day (QID) | ORAL | Status: DC | PRN

## 2013-05-23 MED ORDER — MORPHINE SULFATE 4 MG/ML IJ SOLN
INTRAMUSCULAR | Status: AC
  Filled 2013-05-23: qty 1

## 2013-05-23 NOTE — Discharge Instructions (Signed)
Tonsillitis °Tonsillitis is an infection of the throat. This infection causes the tonsils to become red, tender, and puffy (swollen). Tonsils are groups of tissue at the back of your throat. If bacteria caused your infection, antibiotic medicine will be given to you. Sometimes symptoms of tonsillitis can be relieved with the use of steroid medicine. If your tonsillitis is severe and happens often, you may need to get your tonsils removed (tonsillectomy). °HOME CARE  °· Rest and sleep often. °· Drink enough fluids to keep your pee (urine) clear or pale yellow. °· While your throat is sore, eat soft or liquid foods like: °· Soup. °· Ice cream. °· Instant breakfast drinks. °· Eat frozen ice pops. °· Gargle with a warm or cold liquid to help soothe the throat. Gargle with a water and salt mix. Mix 1/4 teaspoon of salt and 1/4 teaspoon of baking soda in 1 cup of water. °· Only take medicines as told by your doctor. °· If you are given medicines (antibiotics), take them as told. Finish them even if you start to feel better. °GET HELP RIGHT AWAY IF:  °· You throw up (vomit). °· You have a very bad headache. °· You have a stiff neck. °· You have chest pain. °· You have trouble breathing or swallowing. °· You have bad throat pain, drooling, or your voice changes. °· You have bad pain not helped by medicine. °· You cannot fully open your mouth. °· You have redness, puffiness, or bad pain in the neck. °· You have a fever. °· You have a rash. °· You cough up green, yellow-brown, or bloody fluid. °· You cannot swallow liquids or food for 24 hours. °· You notice that only one of your tonsils is swollen. °MAKE SURE YOU:  °· Understand these instructions. °· Will watch your condition. °· Will get help right away if you are not doing well or get worse. °Document Released: 07/26/2007 Document Revised: 10/09/2012 Document Reviewed: 07/26/2012 °[employer]® Patient Information ©2014 [employer]. ° °

## 2013-05-23 NOTE — ED Notes (Signed)
Dr. Annalee GentaShoemaker, ENT, paged earlier for EDP.

## 2013-05-23 NOTE — Consult Note (Signed)
ENT CONSULT:  Reason for Consult: Acute tonsillitis and left peritonsillar abscess Referring Physician: EDP  [name] is an 38 y.o. female.  HPI: The patient presents with a several day history of progressive sore throat, left otalgia and poor oral intake. She was seen at the [facility] emergency department on 4/2, she returned this morning for reevaluation with increased symptoms of sore throat. CT scan showed small 1.3 cm fluid collection in the left tonsil consistent with small peritonsillar abscess. No trismus, drooling or airway issues.  History reviewed. No pertinent past medical history.  Past Surgical History  Procedure Laterality Date  . Cholecystectomy    . Tubal ligation      No family history on file.  Social History:  reports that she has been smoking Cigarettes.  She has been smoking about 1.00 pack per day. She does not have any smokeless tobacco history on file. She reports that she does not drink alcohol or use illicit drugs.  Allergies:  Allergies  Allergen Reactions  . Penicillins Hives    Medications: I have reviewed the patient's current medications.  Results for orders placed during the hospital encounter of 05/22/13 (from the past 48 hour(s))  RAPID STREP SCREEN     Status: None   Collection Time    05/22/13  4:09 AM      Result Value Ref Range   Streptococcus, Group A Screen (Direct) NEGATIVE  NEGATIVE   Comment: (NOTE)     A Rapid Antigen test may result negative if the antigen level in the     sample is below the detection level of this test. The FDA has not     cleared this test as a stand-alone test therefore the rapid antigen     negative result has reflexed to a Group A Strep culture.    Ct Soft Tissue Neck W Contrast  05/23/2013   CLINICAL DATA:  Left-sided neck pain and swelling. Throat pain. Question abscess.  EXAM: CT NECK WITH CONTRAST  TECHNIQUE: Multidetector CT imaging of the neck was performed using the standard protocol  following the bolus administration of intravenous contrast.  CONTRAST:  75mL OMNIPAQUE IOHEXOL 300 MG/ML  SOLN  COMPARISON:  None.  FINDINGS: The palatine tonsils are enlarged bilaterally. A focal area of hypoattenuation with peripheral enhancement is evident within the left palatine tonsil measuring 1.5 x 1.5 x 1.3 cm. The parapharyngeal fat is asymmetrically narrowed on the left. The enlarged left tonsil narrows the airway without critical compromise.  No other focal mucosal or submucosal lesions are present.  And  A left level 1 B lymph node measures 15 x 11 mm. Slightly smaller submental and right submandibular lymph nodes are present. In the left jugulodigastric lymph node measures 16 x 10 mm. A posterior left level 2 lymph node measures 13 mm. Sub cm left level 3 lymph nodes are present. 9 mm left supraclavicular lymph nodes are present.  No significant right-sided adenopathy is evident.  The lung apices are clear.  The thyroid is within normal limits. The bone windows reveal no focal lytic or blastic lesions. There is some straightening of the normal cervical lordosis. Prominent dental caries are evident on the left along both the mandible and maxilla with extensive periapical disease is well. Dental consultation is recommended. Two floating molars are noted in right mandible, suggesting extensive disease along the alveolar ridge.  IMPRESSION: 1. No 1.5 cm focal area peripheral enhancement and central low density in in the left palatine  tonsil is compatible with a developing peritonsillar abscess. 2. Asymmetric left-sided adenopathy is likely reactive. 3. Extensive dental and periodontal disease, left greater than right. Dental consultation is recommended.   Electronically Signed   By: Gennette Pachris  Mattern M.D.   On: 05/23/2013 07:34    ROS: No recent respiratory tract infection, fever or tonsil history, limited  oral diet with odynophagia, no asthma or airway hx  Blood pressure 124/82, pulse 89, temperature  98.4 F (36.9 C), temperature source Oral, resp. rate 16, last menstrual period 05/09/2013, SpO2 99.00%.  PHYSICAL EXAM: General appearance - alert, well appearing, and in no distress Nose - normal and patent, no erythema, discharge or polyps Mouth - mucous membranes moist, pharynx normal without lesions and 2+ tonsils, erythema and exudate bilaterally, mild asymmetry, left tonsil larger. No trismus Neck - adenopathy noted in the left mid neck, mildly tender to palpation.  Studies Reviewed: CT scan of the neck  Assessment/Plan: Patient presents with several day history of acute sore throat consistent with tonsillitis, strep negative. No prior history of tonsillitis, sore throat or previous surgery. CT scan shows small fluid collection in the mid aspect of left tonsil, needle aspiration performed without difficulty for several cc of purulent material. Discharge home with appropriate antibiotics, pain medication and anti-nausea meds. Recommend off precautions including increased oral fluids/soft diet, ibuprofen and limited activity. Expect gradual improvement over the next several days, followup in 3 weeks for recheck or sooner as needed if symptoms recur.  Krisa Blattner 05/23/2013, 11:46 AM

## 2013-05-23 NOTE — ED Notes (Signed)
[facility] called [facility] to services of Dr. Annalee GentaShoemaker.

## 2013-05-23 NOTE — ED Notes (Signed)
Seen here for same complaints last night, states no relief from pain

## 2013-05-23 NOTE — ED Notes (Signed)
Dr Shoemaker at bedside. 

## 2013-05-23 NOTE — ED Provider Notes (Signed)
Patient seen and examined. Care discussed with Dr. below. Her care was assumed by myself at 0700.  CT report shows small left  tonsil peritonsillar abscess. She is able to swallow. She continues to complain of "severe pain". She is not drooling. She is not stridorous. Clinically her airway is not compromised or radiographically this is minimally in density airway without compromise. I discussed the case with Dr. Annalee GentaShoemaker on call for ear nose and throat to the: System. He [facility] where he will see, treat, and examine the patient.  Rolland PorterMark Dianara Smullen, MD 05/23/13 [phone]

## 2013-05-23 NOTE — Progress Notes (Signed)
Patient had earrings and necklace on which needed to be removed for exam. Patient removed earrings prior to coming in room and necklace was removed by MOB. All jewelry was placed in ziplock and given to patient

## 2013-05-23 NOTE — ED Provider Notes (Signed)
CSN: [id number]     Arrival date & time 05/23/13  0551 History   None    Chief Complaint  Patient presents with  . Sore Throat     (Consider location/radiation/quality/duration/timing/severity/associated sxs/prior Treatment) HPI Comments: Patient is a 38 year old female who presents with complaints of left sided throat pain that radiates to her left year. This is been apparently going on for one week. She denies any swelling in her mouth denies any tooth pain. Her pain is worse with swallowing. She was seen here last night and had a strep test performed which was negative. She was prescribed pain medication which she states is doing nothing for her pain. She does not feel as though she was thoroughly evaluated last night for her complaints.  Patient is a 38 y.o. female presenting with pharyngitis. The history is provided by the patient.  Sore Throat This is a new problem. Episode onset: One week ago. The problem occurs constantly. The problem has been gradually worsening. Pertinent negatives include no chest pain and no shortness of breath. Nothing aggravates the symptoms. She has tried nothing for the symptoms. The treatment provided no relief.    History reviewed. No pertinent past medical history. Past Surgical History  Procedure Laterality Date  . Cholecystectomy    . Tubal ligation     No family history on file. History  Substance Use Topics  . Smoking status: Current Every Day Smoker -- 1.00 packs/day    Types: Cigarettes  . Smokeless tobacco: Not on file  . Alcohol Use: No   OB History   Grav Para Term Preterm Abortions TAB SAB Ect Mult Living                 Review of Systems  Respiratory: Negative for shortness of breath.   Cardiovascular: Negative for chest pain.  All other systems reviewed and are negative.      Allergies  Penicillins  Home Medications   Current Outpatient Rx  Name  Route  Sig  Dispense  Refill  . HYDROcodone-acetaminophen (NORCO/VICODIN)  5-325 MG per tablet   Oral   Take 1 tablet by mouth every 6 (six) hours as needed.   15 tablet   0   . lidocaine (XYLOCAINE) 2 % solution   Mouth/Throat   Use as directed 20 mLs in the mouth or throat as needed for mouth pain.   100 mL   0   . promethazine (PHENERGAN) 25 MG tablet   Oral   Take 1 tablet (25 mg total) by mouth every 6 (six) hours as needed for nausea.   15 tablet   0   . naproxen sodium (ALEVE) 220 MG tablet   Oral   Take 220-440 mg by mouth 3 (three) times daily as needed (for pain).          BP 149/89  Pulse 102  Temp(Src) 98.6 F (37 C) (Oral)  Resp 20  SpO2 100% Physical Exam  Nursing note and vitals reviewed. Constitutional: She is oriented to person, place, and time. She appears well-developed and well-nourished. No distress.  HENT:  Head: Normocephalic and atraumatic.  Mouth/Throat: Oropharynx is clear and moist.  There is mild erythema of the posterior oropharynx. There is no exudates. There is no tonsillar asymmetry that would be suggestive of a peritonsillar abscess. There is no cervical adenopathy.  Neck: Normal range of motion. Neck supple. No tracheal deviation present. No thyromegaly present.  Cardiovascular: Normal rate, regular rhythm and normal heart sounds.  Pulmonary/Chest: Effort normal and breath sounds normal. No stridor. No respiratory distress.  Musculoskeletal: Normal range of motion. She exhibits no edema.  Lymphadenopathy:    She has no cervical adenopathy.  Neurological: She is alert and oriented to person, place, and time.  Skin: Skin is warm and dry. She is not diaphoretic.    ED Course  Procedures (including critical care time) Labs Review Labs Reviewed - No data to display Imaging Review No results found.   EKG Interpretation None      MDM   Final diagnoses:  None    Patient presents with complaints of severe left-sided neck and throat pain. She was seen yesterday however treatment has not improved her  symptoms. I see no obvious abscess. Due to the degree of her discomfort, a CT scan of the neck will be obtained to rule out a peritonsillar abscess. Care will be signed at shift change to Dr. Fayrene Fearing who will obtain the results of this test and determine the final disposition.    Geoffery Lyons, MD 05/26/13 2250

## 2013-05-24 LAB — CULTURE, GROUP A STREP

## 2013-05-25 ENCOUNTER — Telehealth (HOSPITAL_BASED_OUTPATIENT_CLINIC_OR_DEPARTMENT_OTHER): Payer: Self-pay | Admitting: Emergency Medicine

## 2013-05-25 NOTE — Telephone Encounter (Signed)
Post ED Visit - Positive Culture Follow-up  Culture report reviewed by antimicrobial stewardship pharmacist: []  Wes Dulaney, Pharm.D., BCPS [x]  Celedonio MiyamotoJeremy Frens, Pharm.D., BCPS []  Georgina PillionElizabeth Martin, Pharm.D., BCPS []  Blue EyeMinh Pham, 17[address] Rainbow BoulevardPharm.D., BCPS, AAHIVP []  Estella HuskMichelle Turner, Pharm.D., BCPS, AAHIVP  Positive strep culture Treated with clindamycin, organism sensitive to the same and no further patient follow-up is required at this time.  Zeb ComfortHolland, Lusia Greis 05/25/2013, 4:47 PM

## 2013-06-04 ENCOUNTER — Emergency Department (HOSPITAL_COMMUNITY)
Admission: EM | Admit: 2013-06-04 | Discharge: 2013-06-04 | Disposition: A | Discharge status: Home / Self Care | Payer: Medicaid Other | Attending: Emergency Medicine | Admitting: Emergency Medicine

## 2013-06-04 ENCOUNTER — Emergency Department (HOSPITAL_COMMUNITY): Payer: Medicaid Other

## 2013-06-04 ENCOUNTER — Encounter (HOSPITAL_COMMUNITY): Payer: Self-pay | Admitting: Emergency Medicine

## 2013-06-04 DIAGNOSIS — D649 Anemia, unspecified: Secondary | ICD-10-CM | POA: Insufficient documentation

## 2013-06-04 DIAGNOSIS — F17200 Nicotine dependence, unspecified, uncomplicated: Secondary | ICD-10-CM | POA: Insufficient documentation

## 2013-06-04 DIAGNOSIS — F411 Generalized anxiety disorder: Secondary | ICD-10-CM | POA: Insufficient documentation

## 2013-06-04 DIAGNOSIS — R200 Anesthesia of skin: Secondary | ICD-10-CM

## 2013-06-04 DIAGNOSIS — R0789 Other chest pain: Secondary | ICD-10-CM

## 2013-06-04 DIAGNOSIS — R071 Chest pain on breathing: Secondary | ICD-10-CM | POA: Insufficient documentation

## 2013-06-04 DIAGNOSIS — Z880 Allergy status to penicillin: Secondary | ICD-10-CM | POA: Insufficient documentation

## 2013-06-04 DIAGNOSIS — Z86711 Personal history of pulmonary embolism: Secondary | ICD-10-CM | POA: Insufficient documentation

## 2013-06-04 DIAGNOSIS — N920 Excessive and frequent menstruation with regular cycle: Secondary | ICD-10-CM | POA: Insufficient documentation

## 2013-06-04 DIAGNOSIS — R209 Unspecified disturbances of skin sensation: Secondary | ICD-10-CM | POA: Insufficient documentation

## 2013-06-04 HISTORY — DX: Other pulmonary embolism without acute cor pulmonale: I26.99

## 2013-06-04 LAB — CBC WITH DIFFERENTIAL/PLATELET
BASOS ABS: 0 10*3/uL (ref 0.0–0.1)
Basophils Relative: 0 % (ref 0–1)
EOS ABS: 0.1 10*3/uL (ref 0.0–0.7)
EOS PCT: 1 % (ref 0–5)
HCT: 29.1 % — ABNORMAL LOW (ref 36.0–46.0)
Hemoglobin: 8.8 g/dL — ABNORMAL LOW (ref 12.0–15.0)
LYMPHS ABS: 2.2 10*3/uL (ref 0.7–4.0)
LYMPHS PCT: 30 % (ref 12–46)
MCH: 23.2 pg — ABNORMAL LOW (ref 26.0–34.0)
MCHC: 30.2 g/dL (ref 30.0–36.0)
MCV: 76.8 fL — AB (ref 78.0–100.0)
Monocytes Absolute: 0.2 10*3/uL (ref 0.1–1.0)
Monocytes Relative: 3 % (ref 3–12)
NEUTROS PCT: 66 % (ref 43–77)
Neutro Abs: 4.8 10*3/uL (ref 1.7–7.7)
PLATELETS: 318 10*3/uL (ref 150–400)
RBC: 3.79 MIL/uL — AB (ref 3.87–5.11)
RDW: 19.2 % — AB (ref 11.5–15.5)
WBC: 7.2 10*3/uL (ref 4.0–10.5)

## 2013-06-04 LAB — COMPREHENSIVE METABOLIC PANEL
ALK PHOS: 83 U/L (ref 39–117)
ALT: 10 U/L (ref 0–35)
AST: 14 U/L (ref 0–37)
Albumin: 3.3 g/dL — ABNORMAL LOW (ref 3.5–5.2)
BUN: 8 mg/dL (ref 6–23)
CALCIUM: 9.2 mg/dL (ref 8.4–10.5)
CO2: 28 meq/L (ref 19–32)
Chloride: 103 mEq/L (ref 96–112)
Creatinine, Ser: 0.72 mg/dL (ref 0.50–1.10)
GFR calc Af Amer: >90 mL/min (ref >90)
GFR calc non Af Amer: >90 mL/min (ref >90)
Glucose, Bld: 105 mg/dL — ABNORMAL HIGH (ref 70–99)
POTASSIUM: 3.7 meq/L (ref 3.7–5.3)
SODIUM: 142 meq/L (ref 137–147)
TOTAL PROTEIN: 7.8 g/dL (ref 6.0–8.3)
Total Bilirubin: <0.2 mg/dL — ABNORMAL LOW (ref 0.3–1.2)

## 2013-06-04 LAB — PROTIME-INR
INR: 0.97 (ref 0.00–1.49)
Prothrombin Time: 12.7 seconds (ref 11.6–15.2)

## 2013-06-04 LAB — APTT: aPTT: 27 seconds (ref 24–37)

## 2013-06-04 MED ORDER — CYCLOBENZAPRINE HCL 10 MG PO TABS
10.0000 mg | ORAL_TABLET | Freq: Once | ORAL | Status: AC
  Administered 2013-06-04: 10 mg via ORAL
  Filled 2013-06-04: qty 1

## 2013-06-04 MED ORDER — LORAZEPAM 2 MG/ML IJ SOLN
1.0000 mg | Freq: Once | INTRAMUSCULAR | Status: AC
  Administered 2013-06-04: 1 mg via INTRAVENOUS
  Filled 2013-06-04: qty 1

## 2013-06-04 MED ORDER — KETOROLAC TROMETHAMINE 30 MG/ML IJ SOLN
30.0000 mg | Freq: Once | INTRAMUSCULAR | Status: AC
Start: 2013-06-04 — End: 2013-06-04
  Administered 2013-06-04: 30 mg via INTRAVENOUS
  Filled 2013-06-04: qty 1

## 2013-06-04 MED ORDER — ONDANSETRON HCL 4 MG/2ML IJ SOLN
4.0000 mg | Freq: Once | INTRAMUSCULAR | Status: AC
  Administered 2013-06-04: 4 mg via INTRAVENOUS
  Filled 2013-06-04: qty 2

## 2013-06-04 MED ORDER — FENTANYL CITRATE 0.05 MG/ML IJ SOLN
50.0000 ug | Freq: Once | INTRAMUSCULAR | Status: AC
Start: 2013-06-04 — End: 2013-06-04
  Administered 2013-06-04: 50 ug via INTRAVENOUS
  Filled 2013-06-04: qty 2

## 2013-06-04 MED ORDER — NAPROXEN 500 MG PO TABS: 500.0000 mg | ORAL_TABLET | Freq: Two times a day (BID) | ORAL | Status: DC

## 2013-06-04 MED ORDER — SODIUM CHLORIDE 0.9 % IV SOLN
INTRAVENOUS | Status: DC
  Administered 2013-06-04: 19:00:00 via INTRAVENOUS

## 2013-06-04 MED ORDER — FENTANYL CITRATE 0.05 MG/ML IJ SOLN
50.0000 ug | Freq: Once | INTRAMUSCULAR | Status: AC
  Administered 2013-06-04: 50 ug via INTRAVENOUS
  Filled 2013-06-04: qty 2

## 2013-06-04 MED ORDER — CYCLOBENZAPRINE HCL 10 MG PO TABS: 10.0000 mg | ORAL_TABLET | Freq: Three times a day (TID) | ORAL | Status: DC | PRN

## 2013-06-04 MED ORDER — FERROUS SULFATE 325 (65 FE) MG PO TABS: 325.0000 mg | ORAL_TABLET | Freq: Three times a day (TID) | ORAL | Status: DC

## 2013-06-04 MED ORDER — IOHEXOL 350 MG/ML SOLN
100.0000 mL | Freq: Once | INTRAVENOUS | Status: AC | PRN
  Administered 2013-06-04: 100 mL via INTRAVENOUS

## 2013-06-04 NOTE — ED Provider Notes (Signed)
CSN: [id number]     Arrival date & time 06/04/13  1639 History   First MD Initiated Contact with Patient 06/04/13 1710     Chief Complaint  Patient presents with  . Back Pain     (Consider location/radiation/quality/duration/timing/severity/associated sxs/prior Treatment) HPI  Patient reports that about 70 this morning she started having some pain in her left upper chest. She states she felt hot but she was able to take a nap. When she woke up she states her pain got worse. About 10:30  she could not use her right arm and leg. She states she is normally right-handed. She states she was unable to pick up things or grabs things with her right arm. About 11 AM in the morning her right posterior chest/shoulder started hurting. She denies cough, fever, chills, pain or swelling in her legs. She denies any recent travel or having to be sedentary. She is not on birth control pills. She states she had a blood clot in 2008 when she was pregnant with one of her children. She states the pain today is similar.  PCP none GYN Dr Emelda Fear  Past Medical History  Diagnosis Date  . PE (pulmonary embolism)   . Back pain    Past Surgical History  Procedure Laterality Date  . Cholecystectomy    . Tubal ligation     History reviewed. No pertinent family history. History  Substance Use Topics  . Smoking status: Current Every Day Smoker -- 1.00 packs/day    Types: Cigarettes  . Smokeless tobacco: Not on file  . Alcohol Use: No   Unemployed Smokes 1/2 ppd  OB History   Grav Para Term Preterm Abortions TAB SAB Ect Mult Living                 Review of Systems  All other systems reviewed and are negative.     Allergies  Penicillins  Home Medications   Prior to Admission medications   Medication Sig Start Date End Date Taking? Authorizing Provider  clindamycin (CLEOCIN) 300 MG capsule Take 1 capsule (300 mg total) by mouth 3 (three) times daily. May open cap and take with liquid 05/23/13   Yes Osborn Coho, MD  oxyCODONE-acetaminophen (ROXICET) 5-325 MG per tablet Take 1-2 tablets by mouth every 6 (six) hours as needed for severe pain. 05/23/13  Yes Osborn Coho, MD  promethazine (PHENERGAN) 12.5 MG tablet Take 1 tablet (12.5 mg total) by mouth every 6 (six) hours as needed for nausea or vomiting. 05/23/13  Yes Osborn Coho, MD  naproxen sodium (ALEVE) 220 MG tablet Take 220-440 mg by mouth 3 (three) times daily as needed (for pain).    Historical Provider, MD   BP 142/95  Pulse 77  Temp(Src) 98.1 F (36.7 C) (Oral)  Resp 18  Ht 5\' 4"  (1.626 m)  Wt 165 lb (74.844 kg)  BMI 28.31 kg/m2  SpO2 100%  LMP 05/09/2013  Vital signs normal   Physical Exam  Nursing note and vitals reviewed. Constitutional: She is oriented to person, place, and time. She appears well-developed and well-nourished.  Non-toxic appearance. She does not appear ill. No distress.  HENT:  Head: Normocephalic and atraumatic.  Right Ear: External ear normal.  Left Ear: External ear normal.  Nose: Nose normal. No mucosal edema or rhinorrhea.  Mouth/Throat: Oropharynx is clear and moist and mucous membranes are normal. No dental abscesses or uvula swelling.  Eyes: Conjunctivae and EOM are normal. Pupils are equal, round, and reactive to light.  Neck: Normal range of motion and full passive range of motion without pain. Neck supple.  Cardiovascular: Normal rate, regular rhythm and normal heart sounds.  Exam reveals no gallop and no friction rub.   No murmur heard. Pulmonary/Chest: Effort normal and breath sounds normal. No respiratory distress. She has no wheezes. She has no rhonchi. She has no rales. She exhibits no tenderness and no crepitus.    Abdominal: Soft. Normal appearance and bowel sounds are normal. She exhibits no distension. There is no tenderness. There is no rebound and no guarding.  Musculoskeletal: Normal range of motion. She exhibits no edema and no tenderness.       Back:  Patient  result extremities well except her right upper remedy she has pain in her right posterior shoulder in extreme abduction. She does not have pain with forward flexion. Patient is tender even to light touch every were in her right posterior chest/shoulder area.  Neurological: She is alert and oriented to person, place, and time. She has normal strength. No cranial nerve deficit.  Skin: Skin is warm, dry and intact. No rash noted. No erythema. No pallor.  Psychiatric: Her behavior is normal. Her mood appears anxious. Her speech is rapid and/or pressured.    ED Course  Procedures (including critical care time)  Medications  0.9 %  sodium chloride infusion ( Intravenous New Bag/Given 06/04/13 1834)  ketorolac (TORADOL) 30 MG/ML injection 30 mg (30 mg Intravenous Given 06/04/13 1834)  fentaNYL (SUBLIMAZE) injection 50 mcg (50 mcg Intravenous Given 06/04/13 1834)  ondansetron (ZOFRAN) injection 4 mg (4 mg Intravenous Given 06/04/13 1834)  LORazepam (ATIVAN) injection 1 mg (1 mg Intravenous Given 06/04/13 1834)  iohexol (OMNIPAQUE) 350 MG/ML injection 100 mL (100 mLs Intravenous Contrast Given 06/04/13 1928)  fentaNYL (SUBLIMAZE) injection 50 mcg (50 mcg Intravenous Given 06/04/13 1953)  cyclobenzaprine (FLEXERIL) tablet 10 mg (10 mg Oral Given 06/04/13 2033)   Nightly and give patient her lab results she has a TV on loudly. She does state she has very heavy periods and sometimes passes clots. She states she seen Dr. Emelda FearFerguson in the past. We discussed taking iron pills and following up with Dr. Emelda FearFerguson. At this point it feel like her pain is mainly musculoskeletal. She will be given prescriptions for same.  Labs Review Results for orders placed during the hospital encounter of 06/04/13  CBC WITH DIFFERENTIAL      Result Value Ref Range   WBC 7.2  4.0 - 10.5 K/uL   RBC 3.79 (*) 3.87 - 5.11 MIL/uL   Hemoglobin 8.8 (*) 12.0 - 15.0 g/dL   HCT 16.129.1 (*) 09.636.0 - 04.546.0 %   MCV 76.8 (*) 78.0 - 100.0 fL   MCH 23.2  (*) 26.0 - 34.0 pg   MCHC 30.2  30.0 - 36.0 g/dL   RDW 40.919.2 (*) 81.111.5 - 91.415.5 %   Platelets 318  150 - 400 K/uL   Neutrophils Relative % 66  43 - 77 %   Neutro Abs 4.8  1.7 - 7.7 K/uL   Lymphocytes Relative 30  12 - 46 %   Lymphs Abs 2.2  0.7 - 4.0 K/uL   Monocytes Relative 3  3 - 12 %   Monocytes Absolute 0.2  0.1 - 1.0 K/uL   Eosinophils Relative 1  0 - 5 %   Eosinophils Absolute 0.1  0.0 - 0.7 K/uL   Basophils Relative 0  0 - 1 %   Basophils Absolute 0.0  0.0 - 0.1 K/uL  COMPREHENSIVE METABOLIC PANEL      Result Value Ref Range   Sodium 142  137 - 147 mEq/L   Potassium 3.7  3.7 - 5.3 mEq/L   Chloride 103  96 - 112 mEq/L   CO2 28  19 - 32 mEq/L   Glucose, Bld 105 (*) 70 - 99 mg/dL   BUN 8  6 - 23 mg/dL   Creatinine, Ser 1.61  0.50 - 1.10 mg/dL   Calcium 9.2  8.4 - 09.6 mg/dL   Total Protein 7.8  6.0 - 8.3 g/dL   Albumin 3.3 (*) 3.5 - 5.2 g/dL   AST 14  0 - 37 U/L   ALT 10  0 - 35 U/L   Alkaline Phosphatase 83  39 - 117 U/L   Total Bilirubin <0.2 (*) 0.3 - 1.2 mg/dL   GFR calc non Af Amer >90  >90 mL/min   GFR calc Af Amer >90  >90 mL/min  APTT      Result Value Ref Range   aPTT 27  24 - 37 seconds  PROTIME-INR      Result Value Ref Range   Prothrombin Time 12.7  11.6 - 15.2 seconds   INR 0.97  0.00 - 1.49   Laboratory interpretation all normal except stable anemia since March 2012     Imaging Review Ct Angio Chest W/cm &/or Wo Cm  06/04/2013   CLINICAL DATA:  Chest pain and shortness of Breath.  EXAM: CT ANGIOGRAPHY CHEST WITH CONTRAST  TECHNIQUE: Multidetector CT imaging of the chest was performed using the standard protocol during bolus administration of intravenous contrast. Multiplanar CT image reconstructions and MIPs were obtained to evaluate the vascular anatomy.  CONTRAST:  OMNIPAQUE IOHEXOL 350 MG/ML SOLN  COMPARISON:  10/06/2005  FINDINGS: The chest wall is unremarkable. No breast masses, supraclavicular or axillary adenopathy. Small scattered  benign-appearing lymph nodes are noted. The thyroid gland appears normal. The bony thorax is intact. No destructive bone lesions or spinal canal compromise.  The heart is normal in size. No pericardial effusion. No mediastinal or hilar mass or adenopathy. The aorta is normal in caliber. No dissection. The major branch vessels are patent.  The esophagus is unremarkable except for a small to moderate-sized hiatal hernia.  The pulmonary arterial tree is well opacified. No filling defects to suggest pulmonary emboli.  The lungs are clear except for dependent atelectasis. No infiltrates, edema or effusions. No worrisome pulmonary lesions. No bronchiectasis or interstitial lung disease.  The upper abdomen is unremarkable.  Review of the MIP images confirms the above findings.  IMPRESSION: No CT findings for pulmonary embolism.  Normal appearance of the heart and great vessels.  No acute pulmonary findings.   Electronically Signed   By: Loralie Champagne M.D.   On: 06/04/2013 19:50   Mr Brain Wo Contrast  06/04/2013   CLINICAL DATA:  Right-sided weakness beginning at 11 o'clock a.m. today.  EXAM: MRI HEAD WITHOUT CONTRAST  TECHNIQUE: Multiplanar, multiecho pulse sequences of the brain and surrounding structures were obtained without intravenous contrast.  COMPARISON:  CT head without contrast 07/14/2004.  FINDINGS: The diffusion-weighted images demonstrate no evidence for acute or subacute infarction. No hemorrhage or mass lesion is present. The ventricles are of normal size. No significant extraaxial fluid collection is present.  Flow is present in the major intracranial arteries. The globes and orbits are intact. The paranasal sinuses are clear. Minimal mucosal thickening is noted along the floor of the right maxillary sinus. The  remaining paranasal sinuses and mastoid air cells are clear.  IMPRESSION: 1. Normal MRI of the brain. 2. Minimal sinus disease.   Electronically Signed   By: Gennette Pachris  Mattern M.D.   On:  06/04/2013 19:35     EKG Interpretation None      MDM   patient has had anemia for the past 2 years however she does describe heavy periods with clots. She most likely has fibroid tumors. She has a GYN she can followup with. She had acute MI, PE, and stroke ruled out today with her testing. Her pain is most likely to be musculoskeletal in origin. She is being discharged.    Final diagnoses:  Anemia  Menorrhagia  Right-sided chest wall pain  Numbness on right side    New Prescriptions   CYCLOBENZAPRINE (FLEXERIL) 10 MG TABLET    Take 1 tablet (10 mg total) by mouth 3 (three) times daily as needed for muscle spasms.   FERROUS SULFATE 325 (65 FE) MG TABLET    Take 1 tablet (325 mg total) by mouth 3 (three) times daily with meals.   NAPROXEN (NAPROSYN) 500 MG TABLET    Take 1 tablet (500 mg total) by mouth 2 (two) times daily.    Plan discharge  Devoria AlbeIva Aaria Happ, MD, Franz DellFACEP     Francetta Ilg L Lanis Storlie, MD 06/04/13 [phone]

## 2013-06-04 NOTE — ED Notes (Signed)
Pt reporting continued pain.  Dr Lynelle DoctorKnapp aware.  No distress noted.  Pt alert, visiting with family and texting.

## 2013-06-04 NOTE — ED Notes (Signed)
Pt co upper rt back pain. Pt states it takes her breath away. Pt has HX of PE and states the pain feels the same way.

## 2013-06-04 NOTE — Discharge Instructions (Signed)
Use ice and heat to the sore muscles in your right posterior chest and neck. Take the medications as prescribed. You need to stay on iron pills as long as you have heavy periods. Call Dr Rayna Sexton office to discuss your heavy periods, you probably have fibroids and he might recommend hormone treatment or even hysterectomy. Return to the ED if you get a fever, or you seem worse.  Abnormal Uterine Bleeding Abnormal uterine bleeding can affect women at various stages in life, including teenagers, women in their reproductive years, pregnant women, and women who have reached menopause. Several kinds of uterine bleeding are considered abnormal, including:  Bleeding or spotting between periods.   Bleeding after sexual intercourse.   Bleeding that is heavier or more than normal.   Periods that last longer than usual.  Bleeding after menopause.  Many cases of abnormal uterine bleeding are minor and simple to treat, while others are more serious. Any type of abnormal bleeding should be evaluated by your health care provider. Treatment will depend on the cause of the bleeding. HOME CARE INSTRUCTIONS Monitor your condition for any changes. The following actions may help to alleviate any discomfort you are experiencing:  Avoid the use of tampons and douches as directed by your health care provider.  Change your pads frequently. You should get regular pelvic exams and Pap tests. Keep all follow-up appointments for diagnostic tests as directed by your health care provider.  SEEK MEDICAL CARE IF:   Your bleeding lasts more than 1 week.   You feel dizzy at times.  SEEK IMMEDIATE MEDICAL CARE IF:   You pass out.   You are changing pads every 15 to 30 minutes.   You have abdominal pain.  You have a fever.   You become sweaty or weak.   You are passing large blood clots from the vagina.   You start to feel nauseous and vomit. MAKE SURE YOU:   Understand these instructions.  Will  watch your condition.  Will get help right away if you are not doing well or get worse. Document Released: 02/06/2005 Document Revised: 10/09/2012 Document Reviewed: 09/05/2012 [facility] Patient Information 2014 [place], [place].  Anemia, Nonspecific Anemia is a condition in which the concentration of red blood cells or hemoglobin in the blood is below normal. Hemoglobin is a substance in red blood cells that carries oxygen to the tissues of the body. Anemia results in not enough oxygen reaching these tissues.  CAUSES  Common causes of anemia include:   Excessive bleeding. Bleeding may be internal or external. This includes excessive bleeding from periods (in women) or from the intestine.   Poor nutrition.   Chronic kidney, thyroid, and liver disease.  Bone marrow disorders that decrease red blood cell production.  Cancer and treatments for cancer.  HIV, AIDS, and their treatments.  Spleen problems that increase red blood cell destruction.  Blood disorders.  Excess destruction of red blood cells due to infection, medicines, and autoimmune disorders. SIGNS AND SYMPTOMS   Minor weakness.   Dizziness.   Headache.  Palpitations.   Shortness of breath, especially with exercise.   Paleness.  Cold sensitivity.  Indigestion.  Nausea.  Difficulty sleeping.  Difficulty concentrating. Symptoms may occur suddenly or they may develop slowly.  DIAGNOSIS  Additional blood tests are often needed. These help your health care provider determine the best treatment. Your health care provider will check your stool for blood and look for other causes of blood loss.  TREATMENT  Treatment varies  depending on the cause of the anemia. Treatment can include:   Supplements of iron, vitamin B12, or folic acid.   Hormone medicines.   A blood transfusion. This may be needed if blood loss is severe.   Hospitalization. This may be needed if there is significant continual blood  loss.   Dietary changes.  Spleen removal. HOME CARE INSTRUCTIONS Keep all follow-up appointments. It often takes many weeks to correct anemia, and having your health care provider check on your condition and your response to treatment is very important. SEEK IMMEDIATE MEDICAL CARE IF:   You develop extreme weakness, shortness of breath, or chest pain.   You become dizzy or have trouble concentrating.  You develop heavy vaginal bleeding.   You develop a rash.   You have bloody or black, tarry stools.   You faint.   You vomit up blood.   You vomit repeatedly.   You have abdominal pain.  You have a fever or persistent symptoms for more than 2 3 days.   You have a fever and your symptoms suddenly get worse.   You are dehydrated.  MAKE SURE YOU:  Understand these instructions.  Will watch your condition.  Will get help right away if you are not doing well or get worse. Document Released: 03/16/2004 Document Revised: 10/09/2012 Document Reviewed: 08/02/2012 [facility] Patient Information 2014 [place], [place].  Chest Wall Pain Chest wall pain is pain in or around the bones and muscles of your chest. It may take up to 6 weeks to get better. It may take longer if you must stay physically active in your work and activities.  CAUSES  Chest wall pain may happen on its own. However, it may be caused by:  A viral illness like the flu.  Injury.  Coughing.  Exercise.  Arthritis.  Fibromyalgia.  Shingles. HOME CARE INSTRUCTIONS   Avoid overtiring physical activity. Try not to strain or perform activities that cause pain. This includes any activities using your chest or your abdominal and side muscles, especially if heavy weights are used.  Put ice on the sore area.  Put ice in a plastic bag.  Place a towel between your skin and the bag.  Leave the ice on for 15-20 minutes per hour while awake for the first 2 days.  Only take over-the-counter or  prescription medicines for pain, discomfort, or fever as directed by your caregiver. SEEK IMMEDIATE MEDICAL CARE IF:   Your pain increases, or you are very uncomfortable.  You have a fever.  Your chest pain becomes worse.  You have new, unexplained symptoms.  You have nausea or vomiting.  You feel sweaty or lightheaded.  You have a cough with phlegm (sputum), or you cough up blood. MAKE SURE YOU:   Understand these instructions.  Will watch your condition.  Will get help right away if you are not doing well or get worse. Document Released: 02/06/2005 Document Revised: 05/01/2011 Document Reviewed: 10/03/2010 [facility] Patient Information 2014 [place], [place].  Menorrhagia Menorrhagia is the medical term for when your menstrual periods are heavy or last longer than usual. With menorrhagia, every period you have may cause enough blood loss and cramping that you are unable to maintain your usual activities. CAUSES  In some cases, the cause of heavy periods is unknown, but a number of conditions may cause menorrhagia. Common causes include:  A problem with the hormone-producing thyroid gland (hypothyroid).  Noncancerous growths in the uterus (polyps or fibroids).  An imbalance of the estrogen  and progesterone hormones.  One of your ovaries not releasing an egg during one or more months.  Side effects of having an intrauterine device (IUD).  Side effects of some medicines, such as anti-inflammatory medicines or blood thinners.  A bleeding disorder that stops your blood from clotting normally. SIGNS AND SYMPTOMS  During a normal period, bleeding lasts between 4 and 8 days. Signs that your periods are too heavy include:  You routinely have to change your pad or tampon every 1 or 2 hours because it is completely soaked.  You pass blood clots larger than 1 inch (2.5 cm) in size.  You have bleeding for more than 7 days.  You need to use pads and tampons at the same time  because of heavy bleeding.  You need to wake up to change your pads or tampons during the night.  You have symptoms of anemia, such as tiredness, fatigue, or shortness of breath. DIAGNOSIS  Your health care provider will perform a physical exam and ask you questions about your symptoms and menstrual history. Other tests may be ordered based on what the health care provider finds during the exam. These tests can include:  Blood tests To check if you are pregnant or have hormonal changes, a bleeding or thyroid disorder, low iron levels (anemia), or other problems.  Endometrial biopsy Your health care provider takes a sample of tissue from the inside of your uterus to be examined under a microscope.  Pelvic ultrasound This test uses sound waves to make a picture of your uterus, ovaries, and vagina. The pictures can show if you have fibroids or other growths.  Hysteroscopy For this test, your health care provider will use a small telescope to look inside your uterus. Based on the results of your initial tests, your health care provider may recommend further testing. TREATMENT  Treatment may not be needed. If it is needed, your health care provider may recommend treatment with one or more medicines first. If these do not reduce bleeding enough, a surgical treatment might be an option. The best treatment for you will depend on:   Whether you need to prevent pregnancy.  Your desire to have children in the future.  The cause and severity of your bleeding.  Your opinion and personal preference.  Medicines for menorrhagia may include:  Birth control methods that use hormones These include the pill, skin patch, vaginal ring, shots that you get every 3 months, hormonal IUD, and implant. These treatments reduce bleeding during your menstrual period.  Medicines that thicken blood and slow bleeding.  Medicines that reduce swelling, such as ibuprofen.  Medicines that contain a synthetic  hormone called progestin.   Medicines that make the ovaries stop working for a short time.  You may need surgical treatment for menorrhagia if the medicines are unsuccessful. Treatment options include:  Dilation and curettage (D&C) In this procedure, your health care provider opens (dilates) your cervix and then scrapes or suctions tissue from the lining of your uterus to reduce menstrual bleeding.  Operative hysteroscopy This procedure uses a tiny tube with a light (hysteroscope) to view your uterine cavity and can help in the surgical removal of a polyp that may be causing heavy periods.  Endometrial ablation Through various techniques, your health care provider permanently destroys the entire lining of your uterus (endometrium). After endometrial ablation, most women have little or no menstrual flow. Endometrial ablation reduces your ability to become pregnant.  Endometrial resection This surgical procedure uses an electrosurgical  wire loop to remove the lining of the uterus. This procedure also reduces your ability to become pregnant.  Hysterectomy Surgical removal of the uterus and cervix is a permanent procedure that stops menstrual periods. Pregnancy is not possible after a hysterectomy. This procedure requires anesthesia and hospitalization. HOME CARE INSTRUCTIONS   Only take over-the-counter or prescription medicines as directed by your health care provider. Take prescribed medicines exactly as directed. Do not change or switch medicines without consulting your health care provider.  Take any prescribed iron pills exactly as directed by your health care provider. Long-term heavy bleeding may result in low iron levels. Iron pills help replace the iron your body lost from heavy bleeding. Iron may cause constipation. If this becomes a problem, increase the bran, fruits, and roughage in your diet.  Do not take aspirin or medicines that contain aspirin 1 week before or during your menstrual  period. Aspirin may make the bleeding worse.  If you need to change your sanitary pad or tampon more than once every 2 hours, stay in bed and rest as much as possible until the bleeding stops.  Eat well-balanced meals. Eat foods high in iron. Examples are leafy green vegetables, meat, liver, eggs, and whole grain breads and cereals. Do not try to lose weight until the abnormal bleeding has stopped and your blood iron level is back to normal. SEEK MEDICAL CARE IF:   You soak through a pad or tampon every 1 or 2 hours, and this happens every time you have a period.  You need to use pads and tampons at the same time because you are bleeding so much.  You need to change your pad or tampon during the night.  You have a period that lasts for more than 8 days.  You pass clots bigger than 1 inch wide.  You have irregular periods that happen more or less often than once a month.  You feel dizzy or faint.  You feel very weak or tired.  You feel short of breath or feel your heart is beating too fast when you exercise.  You have nausea and vomiting or diarrhea while you are taking your medicine.  You have any problems that may be related to the medicine you are taking. SEEK IMMEDIATE MEDICAL CARE IF:   You soak through 4 or more pads or tampons in 2 hours.  You have any bleeding while you are pregnant. MAKE SURE YOU:   Understand these instructions.  Will watch your condition.  Will get help right away if you are not doing well or get worse. Document Released: 02/06/2005 Document Revised: 11/27/2012 Document Reviewed: 07/28/2012 [facility] Patient Information 2014 [place].

## 2013-10-26 ENCOUNTER — Emergency Department (HOSPITAL_COMMUNITY)
Admission: EM | Admit: 2013-10-26 | Discharge: 2013-10-26 | Disposition: A | Discharge status: Home / Self Care | Payer: Medicaid Other | Attending: Emergency Medicine | Admitting: Emergency Medicine

## 2013-10-26 ENCOUNTER — Encounter (HOSPITAL_COMMUNITY): Payer: Self-pay | Admitting: Emergency Medicine

## 2013-10-26 DIAGNOSIS — Z86711 Personal history of pulmonary embolism: Secondary | ICD-10-CM | POA: Insufficient documentation

## 2013-10-26 DIAGNOSIS — M549 Dorsalgia, unspecified: Secondary | ICD-10-CM

## 2013-10-26 DIAGNOSIS — IMO0002 Reserved for concepts with insufficient information to code with codable children: Secondary | ICD-10-CM | POA: Insufficient documentation

## 2013-10-26 DIAGNOSIS — Z792 Long term (current) use of antibiotics: Secondary | ICD-10-CM | POA: Insufficient documentation

## 2013-10-26 DIAGNOSIS — Z791 Long term (current) use of non-steroidal anti-inflammatories (NSAID): Secondary | ICD-10-CM | POA: Insufficient documentation

## 2013-10-26 DIAGNOSIS — Y9389 Activity, other specified: Secondary | ICD-10-CM | POA: Insufficient documentation

## 2013-10-26 DIAGNOSIS — Y9289 Other specified places as the place of occurrence of the external cause: Secondary | ICD-10-CM | POA: Insufficient documentation

## 2013-10-26 DIAGNOSIS — Z880 Allergy status to penicillin: Secondary | ICD-10-CM | POA: Insufficient documentation

## 2013-10-26 DIAGNOSIS — Z79899 Other long term (current) drug therapy: Secondary | ICD-10-CM | POA: Insufficient documentation

## 2013-10-26 DIAGNOSIS — T148XXA Other injury of unspecified body region, initial encounter: Secondary | ICD-10-CM

## 2013-10-26 DIAGNOSIS — W010XXA Fall on same level from slipping, tripping and stumbling without subsequent striking against object, initial encounter: Secondary | ICD-10-CM | POA: Insufficient documentation

## 2013-10-26 DIAGNOSIS — F17200 Nicotine dependence, unspecified, uncomplicated: Secondary | ICD-10-CM | POA: Insufficient documentation

## 2013-10-26 MED ORDER — MELOXICAM 15 MG PO TABS: 15.0000 mg | ORAL_TABLET | Freq: Every day | ORAL | Status: DC

## 2013-10-26 MED ORDER — METHOCARBAMOL 500 MG PO TABS: 1000.0000 mg | ORAL_TABLET | Freq: Three times a day (TID) | ORAL | Status: DC

## 2013-10-26 MED ORDER — HYDROCODONE-ACETAMINOPHEN 5-325 MG PO TABS: 1.0000 | ORAL_TABLET | ORAL | Status: DC | PRN

## 2013-10-26 NOTE — ED Notes (Signed)
Pt reports fell Friday and c/o pain in lower back.  Reports fell up against the car.

## 2013-10-26 NOTE — Discharge Instructions (Signed)
Please soak in a tub of warm water 2 times daily until muscle spasm resolves. Please schedule a appointment with your [employer] [place] access MD for follow up and for possible MRI of your back. Please rest your back as much as possible. Robaxin and norco may cause drowsiness,use with caution. Back Pain, Adult Low back pain is very common. About 1 in 5 people have back pain.The cause of low back pain is rarely dangerous. The pain often gets better over time.About half of people with a sudden onset of back pain feel better in just 2 weeks. About 8 in 10 people feel better by 6 weeks.  CAUSES Some common causes of back pain include:  Strain of the muscles or ligaments supporting the spine.  Wear and tear (degeneration) of the spinal discs.  Arthritis.  Direct injury to the back. DIAGNOSIS Most of the time, the direct cause of low back pain is not known.However, back pain can be treated effectively even when the exact cause of the pain is unknown.Answering your caregiver's questions about your overall health and symptoms is one of the most accurate ways to make sure the cause of your pain is not dangerous. If your caregiver needs more information, he or she may order lab work or imaging tests (X-rays or MRIs).However, even if imaging tests show changes in your back, this usually does not require surgery. HOME CARE INSTRUCTIONS For many people, back pain returns.Since low back pain is rarely dangerous, it is often a condition that people can learn to [facility] their own.   Remain active. It is stressful on the back to sit or stand in one place. Do not sit, drive, or stand in one place for more than 30 minutes at a time. Take short walks on level surfaces as soon as pain allows.Try to increase the length of time you walk each day.  Do not stay in bed.Resting more than 1 or 2 days can delay your recovery.  Do not avoid exercise or work.Your body is made to move.It is not dangerous to be active,  even though your back may hurt.Your back will likely heal faster if you return to being active before your pain is gone.  Pay attention to your body when you bend and lift. Many people have less discomfortwhen lifting if they bend their knees, keep the load close to their bodies,and avoid twisting. Often, the most comfortable positions are those that put less stress on your recovering back.  Find a comfortable position to sleep. Use a firm mattress and lie on your side with your knees slightly bent. If you lie on your back, put a pillow under your knees.  Only take over-the-counter or prescription medicines as directed by your caregiver. Over-the-counter medicines to reduce pain and inflammation are often the most helpful.Your caregiver may prescribe muscle relaxant drugs.These medicines help dull your pain so you can more quickly return to your normal activities and healthy exercise.  Put ice on the injured area.  Put ice in a plastic bag.  Place a towel between your skin and the bag.  Leave the ice on for 15-20 minutes, 03-04 times a day for the first 2 to 3 days. After that, ice and heat may be alternated to reduce pain and spasms.  Ask your caregiver about trying back exercises and gentle massage. This may be of some benefit.  Avoid feeling anxious or stressed.Stress increases muscle tension and can worsen back pain.It is important to recognize when you are anxious or  stressed and learn ways to manage it.Exercise is a great option. SEEK MEDICAL CARE IF:  You have pain that is not relieved with rest or medicine.  You have pain that does not improve in 1 week.  You have new symptoms.  You are generally not feeling well. SEEK IMMEDIATE MEDICAL CARE IF:   You have pain that radiates from your back into your legs.  You develop new bowel or bladder control problems.  You have unusual weakness or numbness in your arms or legs.  You develop nausea or vomiting.  You develop  abdominal pain.  You feel faint. Document Released: 02/06/2005 Document Revised: 08/08/2011 Document Reviewed: 06/10/2013 [facility] Patient Information 2015 [place], [place]. This information is not intended to replace advice given to you by your health care provider. Make sure you discuss any questions you have with your health care provider.
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
## 2013-10-27 NOTE — ED Provider Notes (Signed)
Medical screening examination/treatment/procedure(s) were performed by non-physician practitioner and as supervising physician I was immediately available for consultation/collaboration.   EKG Interpretation None       Alaze Garverick M Janiel Derhammer, MD 10/27/13 2154 

## 2014-06-02 ENCOUNTER — Emergency Department (HOSPITAL_COMMUNITY)
Admission: EM | Admit: 2014-06-02 | Discharge: 2014-06-02 | Disposition: A | Discharge status: Home / Self Care | Payer: Medicaid Other | Attending: Emergency Medicine | Admitting: Emergency Medicine

## 2014-06-02 ENCOUNTER — Encounter (HOSPITAL_COMMUNITY): Payer: Self-pay | Admitting: Emergency Medicine

## 2014-06-02 DIAGNOSIS — Z86711 Personal history of pulmonary embolism: Secondary | ICD-10-CM | POA: Insufficient documentation

## 2014-06-02 DIAGNOSIS — K088 Other specified disorders of teeth and supporting structures: Secondary | ICD-10-CM | POA: Insufficient documentation

## 2014-06-02 DIAGNOSIS — Z791 Long term (current) use of non-steroidal anti-inflammatories (NSAID): Secondary | ICD-10-CM | POA: Insufficient documentation

## 2014-06-02 DIAGNOSIS — K029 Dental caries, unspecified: Secondary | ICD-10-CM

## 2014-06-02 DIAGNOSIS — Z720 Tobacco use: Secondary | ICD-10-CM | POA: Insufficient documentation

## 2014-06-02 DIAGNOSIS — Z880 Allergy status to penicillin: Secondary | ICD-10-CM | POA: Insufficient documentation

## 2014-06-02 DIAGNOSIS — Z79899 Other long term (current) drug therapy: Secondary | ICD-10-CM | POA: Insufficient documentation

## 2014-06-02 DIAGNOSIS — Z792 Long term (current) use of antibiotics: Secondary | ICD-10-CM | POA: Insufficient documentation

## 2014-06-02 MED ORDER — CLINDAMYCIN HCL 150 MG PO CAPS: 150.0000 mg | ORAL_CAPSULE | Freq: Four times a day (QID) | ORAL | Status: DC

## 2014-06-02 MED ORDER — NAPROXEN 500 MG PO TABS: 500.0000 mg | ORAL_TABLET | Freq: Two times a day (BID) | ORAL | Status: DC

## 2014-06-02 MED ORDER — OXYCODONE-ACETAMINOPHEN 5-325 MG PO TABS
1.0000 | ORAL_TABLET | Freq: Once | ORAL | Status: AC
  Administered 2014-06-02: 1 via ORAL
  Filled 2014-06-02: qty 1

## 2014-06-02 MED ORDER — CLINDAMYCIN HCL 150 MG PO CAPS
300.0000 mg | ORAL_CAPSULE | Freq: Once | ORAL | Status: AC
  Administered 2014-06-02: 300 mg via ORAL
  Filled 2014-06-02: qty 2

## 2014-06-02 NOTE — ED Notes (Signed)
Pt c/o dental pain x 3 days.

## 2014-06-02 NOTE — Discharge Instructions (Signed)
Dental Caries Dental caries is tooth decay. This decay can cause a hole in teeth (cavity) that can get bigger and deeper over time. HOME CARE  Brush and floss your teeth. Do this at least two times a day.  Use a fluoride toothpaste.  Use a mouth rinse if told by your dentist or doctor.  Eat less sugary and starchy foods. Drink less sugary drinks.  Avoid snacking often on sugary and starchy foods. Avoid sipping often on sugary drinks.  Keep regular checkups and cleanings with your dentist.  Use fluoride supplements if told by your dentist or doctor.  Allow fluoride to be applied to teeth if told by your dentist or doctor. Document Released: 11/16/2007 Document Revised: 06/23/2013 Document Reviewed: 02/09/2012 [facility] Patient Information 2015 [place], [place]. This information is not intended to replace advice given to you by your health care provider. Make sure you discuss any questions you have with your health care provider.  Dental Care and Dentist Visits Dental care supports good overall health. Regular dental visits can also help you avoid dental pain, bleeding, infection, and other more serious health problems in the future. It is important to keep the mouth healthy because diseases in the teeth, gums, and other oral tissues can spread to other areas of the body. Some problems, such as diabetes, heart disease, and pre-term labor have been associated with poor oral health.  See your dentist every 6 months. If you experience emergency problems such as a toothache or broken tooth, go to the dentist right away. If you see your dentist regularly, you may catch problems early. It is easier to be treated for problems in the early stages.  WHAT TO EXPECT AT A DENTIST VISIT  Your dentist will look for many common oral health problems and recommend proper treatment. At your regular dental visit, you can expect:  Gentle cleaning of the teeth and gums. This includes scraping and polishing. This  helps to remove the sticky substance around the teeth and gums (plaque). Plaque forms in the mouth shortly after eating. Over time, plaque hardens on the teeth as tartar. If tartar is not removed regularly, it can cause problems. Cleaning also helps remove stains.  Periodic X-rays. These pictures of the teeth and supporting bone will help your dentist assess the health of your teeth.  Periodic fluoride treatments. Fluoride is a natural mineral shown to help strengthen teeth. Fluoride treatmentinvolves applying a fluoride gel or varnish to the teeth. It is most commonly done in children.  Examination of the mouth, tongue, jaws, teeth, and gums to look for any oral health problems, such as:  Cavities (dental caries). This is decay on the tooth caused by plaque, sugar, and acid in the mouth. It is best to catch a cavity when it is small.  Inflammation of the gums caused by plaque buildup (gingivitis).  Problems with the mouth or malformed or misaligned teeth.  Oral cancer or other diseases of the soft tissues or jaws. KEEP YOUR TEETH AND GUMS HEALTHY For healthy teeth and gums, follow these general guidelines as well as your dentist's specific advice:  Have your teeth professionally cleaned at the dentist every 6 months.  Brush twice daily with a fluoride toothpaste.  Floss your teeth daily.  Ask your dentist if you need fluoride supplements, treatments, or fluoride toothpaste.  Eat a healthy diet. Reduce foods and drinks with added sugar.  Avoid smoking. TREATMENT FOR ORAL HEALTH PROBLEMS If you have oral health problems, treatment varies depending on  the conditions present in your teeth and gums. °· Your caregiver will most likely recommend good oral hygiene at each visit. °· For cavities, gingivitis, or other oral health disease, your caregiver will perform a procedure to treat the problem. This is typically done at a separate appointment. Sometimes your caregiver will refer you to  another dental specialist for specific tooth problems or for surgery. °SEEK IMMEDIATE DENTAL CARE IF: °· You have pain, bleeding, or soreness in the gum, tooth, jaw, or mouth area. °· A permanent tooth becomes loose or separated from the gum socket. °· You experience a blow or injury to the mouth or jaw area. °Document Released: 10/19/2010 Document Revised: 05/01/2011 Document Reviewed: 10/19/2010 °[employer]® Patient Information ©2015 [employer]. This information is not intended to replace advice given to you by your health care provider. Make sure you discuss any questions you have with your health care provider. ° °Dental Pain °A tooth ache may be caused by cavities (tooth decay). Cavities expose the nerve of the tooth to air and hot or cold temperatures. It may come from an infection or abscess (also called a boil or furuncle) around your tooth. It is also often caused by dental caries (tooth decay). This causes the pain you are having. °DIAGNOSIS  °Your caregiver can diagnose this problem by exam. °TREATMENT  °· If caused by an infection, it may be treated with medications which kill germs (antibiotics) and pain medications as prescribed by your caregiver. Take medications as directed. °· Only take over-the-counter or prescription medicines for pain, discomfort, or fever as directed by your caregiver. °· Whether the tooth ache today is caused by infection or dental disease, you should see your dentist as soon as possible for further care. °SEEK MEDICAL CARE IF: °The exam and treatment you received today has been provided on an emergency basis only. This is not a substitute for complete medical or dental care. If your problem worsens or new problems (symptoms) appear, and you are unable to meet with your dentist, call or return to this location. °SEEK IMMEDIATE MEDICAL CARE IF:  °· You have a fever. °· You develop redness and swelling of your face, jaw, or neck. °· You are unable to open your mouth. °· You have  severe pain uncontrolled by pain medicine. °MAKE SURE YOU:  °· Understand these instructions. °· Will watch your condition. °· Will get help right away if you are not doing well or get worse. °Document Released: 02/06/2005 Document Revised: 05/01/2011 Document Reviewed: 09/25/2007 °[employer]® Patient Information ©2015 [employer]. This information is not intended to replace advice given to you by your health care provider. Make sure you discuss any questions you have with your health care provider. ° °

## 2014-06-02 NOTE — ED Provider Notes (Signed)
CSN: [id number]     Arrival date & time 06/02/14  0021 History   First MD Initiated Contact with Patient 06/02/14 0051     Chief Complaint  Patient presents with  . Dental Pain     (Consider location/radiation/quality/duration/timing/severity/associated sxs/prior Treatment) Patient is a 39 y.o. female presenting with tooth pain. The history is provided by the patient.  Dental Pain Location:  Lower Lower teeth location:  17/LL 3rd molar and 18/LL 2nd molar Quality:  Aching, throbbing and constant Onset quality:  Gradual Duration:  3 days Timing:  Constant Progression:  Worsening Chronicity:  New Context: crown fracture and dental caries   Relieved by:  Nothing Worsened by:  Cold food/drink, pressure and jaw movement Ineffective treatments:  Topical anesthetic gel and NSAIDs Associated symptoms: gum swelling   Risk factors: smoking    [name] is a 39 y.o. female who presents to the ED with dental pain. The pain started 3 days ago. She has a history of dental problems but has not been to the dentist.   Past Medical History  Diagnosis Date  . PE (pulmonary embolism)   . Back pain    Past Surgical History  Procedure Laterality Date  . Cholecystectomy    . Tubal ligation     History reviewed. No pertinent family history. History  Substance Use Topics  . Smoking status: Current Every Day Smoker -- 1.00 packs/day    Types: Cigarettes  . Smokeless tobacco: Not on file  . Alcohol Use: Yes   OB History    No data available     Review of Systems  HENT: Positive for dental problem.   all other systems negative    Allergies  Penicillins  Home Medications   Prior to Admission medications   Medication Sig Start Date End Date Taking? Authorizing Provider  clindamycin (CLEOCIN) 150 MG capsule Take 1 capsule (150 mg total) by mouth every 6 (six) hours. 06/02/14   Hope Orlene OchM Neese, NP  cyclobenzaprine (FLEXERIL) 10 MG tablet Take 1 tablet (10 mg total) by mouth 3  (three) times daily as needed for muscle spasms. 06/04/13   Devoria AlbeIva Knapp, MD  ferrous sulfate 325 (65 FE) MG tablet Take 1 tablet (325 mg total) by mouth 3 (three) times daily with meals. 06/04/13   Devoria AlbeIva Knapp, MD  HYDROcodone-acetaminophen (NORCO/VICODIN) 5-325 MG per tablet Take 1 tablet by mouth every 4 (four) hours as needed. 10/26/13   Ivery QualeHobson Bryant, PA-C  methocarbamol (ROBAXIN) 500 MG tablet Take 2 tablets (1,000 mg total) by mouth 3 (three) times daily. 10/26/13   Ivery QualeHobson Bryant, PA-C  naproxen (NAPROSYN) 500 MG tablet Take 1 tablet (500 mg total) by mouth 2 (two) times daily. 06/02/14   Hope Orlene OchM Neese, NP   BP 128/91 mmHg  Pulse 105  Temp(Src) 98.3 F (36.8 C)  Resp 18  Ht 5\' 6"  (1.676 m)  Wt 172 lb (78.019 kg)  BMI 27.77 kg/m2  SpO2 100%  LMP 05/18/2014 Physical Exam  Constitutional: She is oriented to person, place, and time. She appears well-developed and well-nourished.  HENT:  Head: Normocephalic.  Right Ear: Tympanic membrane normal.  Left Ear: Tympanic membrane normal.  Nose: Nose normal.  Mouth/Throat: Uvula is midline, oropharynx is clear and moist and mucous membranes are normal.    Second and third molar with decay to the gumline. Tender on exam.   Eyes: Conjunctivae and EOM are normal.  Neck: Neck supple.  Cardiovascular: Normal rate.   Pulmonary/Chest: Effort normal.  Musculoskeletal: Normal range of motion.  Lymphadenopathy:    She has no cervical adenopathy.  Neurological: She is alert and oriented to person, place, and time. No cranial nerve deficit.  Skin: Skin is warm and dry.  Psychiatric: She has a normal mood and affect. Her behavior is normal.  Nursing note and vitals reviewed.   ED Course  Procedures   MDM  39 y.o. female with dental pain due to caries. Discussed with the patient that I will prescribe antibiotics and NSAIDS but she must see a dentist to have the teeth extracted before the problem will go away. She voices understanding and agrees with  plan.  First dose of Clindamycin given prior to d/c.    Final diagnoses:  Pain due to dental caries       Janne Napoleon, NP 06/02/14 0222  Paula Libra, MD 06/02/14 [phone]

## 2015-04-25 ENCOUNTER — Emergency Department (HOSPITAL_COMMUNITY)
Admission: EM | Admit: 2015-04-25 | Discharge: 2015-04-26 | Disposition: A | Discharge status: Home / Self Care | Payer: Self-pay | Attending: Emergency Medicine | Admitting: Emergency Medicine

## 2015-04-25 ENCOUNTER — Encounter (HOSPITAL_COMMUNITY): Payer: Self-pay | Admitting: Emergency Medicine

## 2015-04-25 ENCOUNTER — Emergency Department (HOSPITAL_COMMUNITY): Payer: Self-pay

## 2015-04-25 DIAGNOSIS — Z792 Long term (current) use of antibiotics: Secondary | ICD-10-CM | POA: Insufficient documentation

## 2015-04-25 DIAGNOSIS — R202 Paresthesia of skin: Secondary | ICD-10-CM | POA: Insufficient documentation

## 2015-04-25 DIAGNOSIS — Z79899 Other long term (current) drug therapy: Secondary | ICD-10-CM | POA: Insufficient documentation

## 2015-04-25 DIAGNOSIS — M7989 Other specified soft tissue disorders: Secondary | ICD-10-CM | POA: Insufficient documentation

## 2015-04-25 DIAGNOSIS — G8929 Other chronic pain: Secondary | ICD-10-CM | POA: Insufficient documentation

## 2015-04-25 DIAGNOSIS — Z86718 Personal history of other venous thrombosis and embolism: Secondary | ICD-10-CM | POA: Insufficient documentation

## 2015-04-25 DIAGNOSIS — Z880 Allergy status to penicillin: Secondary | ICD-10-CM | POA: Insufficient documentation

## 2015-04-25 DIAGNOSIS — I10 Essential (primary) hypertension: Secondary | ICD-10-CM | POA: Insufficient documentation

## 2015-04-25 DIAGNOSIS — Z862 Personal history of diseases of the blood and blood-forming organs and certain disorders involving the immune mechanism: Secondary | ICD-10-CM | POA: Insufficient documentation

## 2015-04-25 DIAGNOSIS — F17210 Nicotine dependence, cigarettes, uncomplicated: Secondary | ICD-10-CM | POA: Insufficient documentation

## 2015-04-25 DIAGNOSIS — M79605 Pain in left leg: Secondary | ICD-10-CM | POA: Insufficient documentation

## 2015-04-25 DIAGNOSIS — M5432 Sciatica, left side: Secondary | ICD-10-CM | POA: Insufficient documentation

## 2015-04-25 HISTORY — DX: Anemia, unspecified: D64.9

## 2015-04-25 HISTORY — DX: Dorsalgia, unspecified: M54.9

## 2015-04-25 HISTORY — DX: Sciatica, unspecified side: M54.30

## 2015-04-25 HISTORY — DX: Other chronic pain: G89.29

## 2015-04-25 HISTORY — DX: Essential (primary) hypertension: I10

## 2015-04-25 LAB — CBC WITH DIFFERENTIAL/PLATELET
BASOS PCT: 0 %
Basophils Absolute: 0 10*3/uL (ref 0.0–0.1)
Eosinophils Absolute: 0.1 10*3/uL (ref 0.0–0.7)
Eosinophils Relative: 1 %
HEMATOCRIT: 31 % — AB (ref 36.0–46.0)
HEMOGLOBIN: 9.8 g/dL — AB (ref 12.0–15.0)
Lymphocytes Relative: 32 %
Lymphs Abs: 2.5 10*3/uL (ref 0.7–4.0)
MCH: 25.6 pg — ABNORMAL LOW (ref 26.0–34.0)
MCHC: 31.6 g/dL (ref 30.0–36.0)
MCV: 80.9 fL (ref 78.0–100.0)
Monocytes Absolute: 0.5 10*3/uL (ref 0.1–1.0)
Monocytes Relative: 7 %
NEUTROS ABS: 4.7 10*3/uL (ref 1.7–7.7)
NEUTROS PCT: 60 %
Platelets: 221 10*3/uL (ref 150–400)
RBC: 3.83 MIL/uL — AB (ref 3.87–5.11)
RDW: 17.2 % — ABNORMAL HIGH (ref 11.5–15.5)
WBC: 7.8 10*3/uL (ref 4.0–10.5)

## 2015-04-25 LAB — BASIC METABOLIC PANEL
ANION GAP: 4 — AB (ref 5–15)
BUN: 10 mg/dL (ref 6–20)
CHLORIDE: 105 mmol/L (ref 101–111)
CO2: 26 mmol/L (ref 22–32)
CREATININE: 0.78 mg/dL (ref 0.44–1.00)
Calcium: 8.4 mg/dL — ABNORMAL LOW (ref 8.9–10.3)
GFR calc non Af Amer: >60 mL/min (ref >60)
Glucose, Bld: 107 mg/dL — ABNORMAL HIGH (ref 65–99)
Potassium: 3.5 mmol/L (ref 3.5–5.1)
SODIUM: 135 mmol/L (ref 135–145)

## 2015-04-25 LAB — CK: CK TOTAL: 140 U/L (ref 38–234)

## 2015-04-25 LAB — I-STAT BETA HCG BLOOD, ED (MC, WL, AP ONLY)

## 2015-04-25 LAB — D-DIMER, QUANTITATIVE (NOT AT ARMC): D DIMER QUANT: 3.31 ug{FEU}/mL — AB (ref 0.00–0.50)

## 2015-04-25 MED ORDER — DIAZEPAM 5 MG PO TABS
5.0000 mg | ORAL_TABLET | Freq: Once | ORAL | Status: AC
  Administered 2015-04-25: 5 mg via ORAL
  Filled 2015-04-25: qty 1

## 2015-04-25 MED ORDER — ONDANSETRON HCL 4 MG PO TABS
4.0000 mg | ORAL_TABLET | Freq: Once | ORAL | Status: AC
  Administered 2015-04-25: 4 mg via ORAL
  Filled 2015-04-25: qty 1

## 2015-04-25 MED ORDER — HYDROMORPHONE HCL 1 MG/ML IJ SOLN
1.0000 mg | Freq: Once | INTRAMUSCULAR | Status: AC
  Administered 2015-04-25: 1 mg via INTRAMUSCULAR
  Filled 2015-04-25: qty 1

## 2015-04-25 NOTE — ED Notes (Signed)
Pt gone to CT 

## 2015-04-25 NOTE — ED Notes (Signed)
Patient c/o left leg pain and swelling starting 4 days ago. Patient states pain is worse when she walks, but today is unable to bear weight on affected extremity.

## 2015-04-25 NOTE — ED Provider Notes (Signed)
CSN: [id number]     Arrival date & time 04/25/15  2047 History   First MD Initiated Contact with Patient 04/25/15 2138     Chief Complaint  Patient presents with  . Leg Pain      HPI  Pt was seen at 2140. Per pt, c/o gradual onset and worsening of persistent LLE "pain" and "swelling" that began 4 days ago. Has been associated with left sided LBP. States her LLE symptoms are from her thigh to her foot. Describes the pain as "pins and needles." Pain worsens with palpation of the area, body position changes, and weight bearing. Denies abd pain, no fevers, no rash, no focal motor weakness, no tingling/numbness in extremities, no saddle anesthesia, no incont/retention of bowel or bladder, no new injury, no CP/SOB.   Past Medical History  Diagnosis Date  . PE (pulmonary embolism) 09/2004    while pregnant  . Chronic back pain   . Sciatica   . Hypertension   . Anemia    Past Surgical History  Procedure Laterality Date  . Cholecystectomy    . Tubal ligation      Social History  Substance Use Topics  . Smoking status: Current Every Day Smoker -- 1.00 packs/day    Types: Cigarettes  . Smokeless tobacco: None  . Alcohol Use: Yes    Review of Systems ROS: Statement: All systems negative except as marked or noted in the HPI; Constitutional: Negative for fever and chills. ; ; Eyes: Negative for eye pain, redness and discharge. ; ; ENMT: Negative for ear pain, hoarseness, nasal congestion, sinus pressure and sore throat. ; ; Cardiovascular: Negative for chest pain, palpitations, diaphoresis, dyspnea and peripheral edema. ; ; Respiratory: Negative for cough, wheezing and stridor. ; ; Gastrointestinal: Negative for nausea, vomiting, diarrhea, abdominal pain, blood in stool, hematemesis, jaundice and rectal bleeding. . ; ; Genitourinary: Negative for dysuria, flank pain and hematuria. ; ; Musculoskeletal: +LLE "pain and swelling," LBP. Negative for neck pain. Negative for deformity and trauma.; ;  Skin: Negative for pruritus, rash, abrasions, blisters, bruising and skin lesion.; ; Neuro: Negative for headache, lightheadedness and neck stiffness. Negative for weakness, altered level of consciousness , altered mental status, extremity weakness, paresthesias, involuntary movement, seizure and syncope.      Allergies  Penicillins  Home Medications   Prior to Admission medications   Medication Sig Start Date End Date Taking? Authorizing Provider  clindamycin (CLEOCIN) 150 MG capsule Take 1 capsule (150 mg total) by mouth every 6 (six) hours. 06/02/14   Hope Orlene Och, NP  cyclobenzaprine (FLEXERIL) 10 MG tablet Take 1 tablet (10 mg total) by mouth 3 (three) times daily as needed for muscle spasms. 06/04/13   Devoria Albe, MD  ferrous sulfate 325 (65 FE) MG tablet Take 1 tablet (325 mg total) by mouth 3 (three) times daily with meals. 06/04/13   Devoria Albe, MD  HYDROcodone-acetaminophen (NORCO/VICODIN) 5-325 MG per tablet Take 1 tablet by mouth every 4 (four) hours as needed. 10/26/13   Ivery Quale, PA-C  methocarbamol (ROBAXIN) 500 MG tablet Take 2 tablets (1,000 mg total) by mouth 3 (three) times daily. 10/26/13   Ivery Quale, PA-C  naproxen (NAPROSYN) 500 MG tablet Take 1 tablet (500 mg total) by mouth 2 (two) times daily. 06/02/14   Hope Orlene Och, NP   BP 136/101 mmHg  Pulse 94  Resp 22  SpO2 100%  LMP 04/12/2015 Physical Exam  2145: Physical examination:  Nursing notes reviewed; Vital signs and O2  SAT reviewed;  Constitutional: Well developed, Well nourished, Well hydrated, In no acute distress; Head:  Normocephalic, atraumatic; Eyes: EOMI, PERRL, No scleral icterus; ENMT: Mouth and pharynx normal, Mucous membranes moist; Neck: Supple, Full range of motion, No lymphadenopathy; Cardiovascular: Regular rate and rhythm, No gallop; Respiratory: Breath sounds clear & equal bilaterally, No wheezes.  Speaking full sentences with ease, Normal respiratory effort/excursion; Chest: Nontender, Movement  normal; Abdomen: Soft, Nontender, Nondistended, Normal bowel sounds; Genitourinary: No CVA tenderness; Extremities: Pulses normal, No deformity. +1 edema from thigh to ankle with mild calf asymmetry, TTP thigh to foot without specific area of point tenderness, muscles compartments soft, palpable RLE pulses, brisk cap refill in toes. No rash, no pallor, no ecchymosis, no open wounds. No edema to hip/knee/ankle/foot..; Neuro: AA&Ox3, Major CN grossly intact.  Speech clear. No gross focal motor or sensory deficits in extremities.; Skin: Color normal, Warm, Dry.   ED Course  Procedures (including critical care time) Labs Review  Imaging Review  I have personally reviewed and evaluated these images and lab results as part of my medical decision-making.   EKG Interpretation None      MDM  MDM Reviewed: previous chart, nursing note and vitals Reviewed previous: labs Interpretation: labs and CT scan   Results for orders placed or performed during the hospital encounter of 04/25/15  Basic metabolic panel  Result Value Ref Range   Sodium 135 135 - 145 mmol/L   Potassium 3.5 3.5 - 5.1 mmol/L   Chloride 105 101 - 111 mmol/L   CO2 26 22 - 32 mmol/L   Glucose, Bld 107 (H) 65 - 99 mg/dL   BUN 10 6 - 20 mg/dL   Creatinine, Ser 4.090.78 0.44 - 1.00 mg/dL   Calcium 8.4 (L) 8.9 - 10.3 mg/dL   GFR calc non Af Amer >60 >60 mL/min   GFR calc Af Amer >60 >60 mL/min   Anion gap 4 (L) 5 - 15  CBC with Differential  Result Value Ref Range   WBC 7.8 4.0 - 10.5 K/uL   RBC 3.83 (L) 3.87 - 5.11 MIL/uL   Hemoglobin 9.8 (L) 12.0 - 15.0 g/dL   HCT 81.131.0 (L) 91.436.0 - 78.246.0 %   MCV 80.9 78.0 - 100.0 fL   MCH 25.6 (L) 26.0 - 34.0 pg   MCHC 31.6 30.0 - 36.0 g/dL   RDW 95.617.2 (H) 21.311.5 - 08.615.5 %   Platelets 221 150 - 400 K/uL   Neutrophils Relative % 60 %   Neutro Abs 4.7 1.7 - 7.7 K/uL   Lymphocytes Relative 32 %   Lymphs Abs 2.5 0.7 - 4.0 K/uL   Monocytes Relative 7 %   Monocytes Absolute 0.5 0.1 - 1.0 K/uL    Eosinophils Relative 1 %   Eosinophils Absolute 0.1 0.0 - 0.7 K/uL   Basophils Relative 0 %   Basophils Absolute 0.0 0.0 - 0.1 K/uL  D-dimer, quantitative  Result Value Ref Range   D-Dimer, Quant 3.31 (H) 0.00 - 0.50 ug/mL-FEU  CK  Result Value Ref Range   Total CK 140 38 - 234 U/L  I-Stat beta hCG blood, ED  Result Value Ref Range   I-stat hCG, quantitative <5.0 <5 mIU/mL   Comment 3             2150:  Pt was screaming loudly and pushing my hand away during my exam, flexing her left foot and shaking it, and her entire LLE, during my exam. Will dose pain meds and muscle relaxer  as workup progresses.   2330:  H/H per baseline. CT LS pending. Pt will need lovenox SQ tonight, Vasc US LLE scheduled for tomorrow morning, and will need to demonstrate ambulation before dispo. Sign out to Dr. Wilkie Aye.       Samuel Jester, DO 04/25/15 2336

## 2015-04-26 ENCOUNTER — Ambulatory Visit (HOSPITAL_COMMUNITY)
Admission: RE | Admit: 2015-04-26 | Discharge: 2015-04-26 | Disposition: A | Discharge status: Home / Self Care | Payer: Medicaid Other | Source: Ambulatory Visit | Attending: Emergency Medicine | Admitting: Emergency Medicine

## 2015-04-26 ENCOUNTER — Other Ambulatory Visit (HOSPITAL_COMMUNITY): Payer: Self-pay | Admitting: Emergency Medicine

## 2015-04-26 DIAGNOSIS — I82890 Acute embolism and thrombosis of other specified veins: Secondary | ICD-10-CM | POA: Insufficient documentation

## 2015-04-26 DIAGNOSIS — M7989 Other specified soft tissue disorders: Secondary | ICD-10-CM | POA: Insufficient documentation

## 2015-04-26 DIAGNOSIS — M79662 Pain in left lower leg: Secondary | ICD-10-CM

## 2015-04-26 DIAGNOSIS — I82432 Acute embolism and thrombosis of left popliteal vein: Secondary | ICD-10-CM | POA: Insufficient documentation

## 2015-04-26 DIAGNOSIS — M79605 Pain in left leg: Secondary | ICD-10-CM

## 2015-04-26 MED ORDER — ENOXAPARIN SODIUM 100 MG/ML ~~LOC~~ SOLN
1.0000 mg/kg | Freq: Once | SUBCUTANEOUS | Status: AC
  Administered 2015-04-26: 90 mg via SUBCUTANEOUS
  Filled 2015-04-26: qty 1

## 2015-04-26 MED ORDER — HYDROCODONE-ACETAMINOPHEN 5-325 MG PO TABS: 1.0000 | ORAL_TABLET | ORAL | Status: DC | PRN

## 2015-04-26 MED ORDER — DEXAMETHASONE SODIUM PHOSPHATE 4 MG/ML IJ SOLN
10.0000 mg | Freq: Once | INTRAMUSCULAR | Status: DC
  Filled 2015-04-26: qty 3

## 2015-04-26 MED ORDER — XARELTO VTE STARTER PACK 15 & 20 MG PO TBPK: 15.0000 mg | ORAL_TABLET | ORAL | Status: DC

## 2015-04-26 MED ORDER — METHYLPREDNISOLONE 4 MG PO TBPK: ORAL_TABLET | ORAL | Status: DC

## 2015-04-26 MED ORDER — ONDANSETRON 4 MG PO TBDP
4.0000 mg | ORAL_TABLET | Freq: Once | ORAL | Status: AC
  Administered 2015-04-26: 4 mg via ORAL
  Filled 2015-04-26: qty 1

## 2015-04-26 MED ORDER — DEXAMETHASONE SODIUM PHOSPHATE 4 MG/ML IJ SOLN
10.0000 mg | Freq: Once | INTRAMUSCULAR | Status: AC
  Administered 2015-04-26: 10 mg via INTRAMUSCULAR

## 2015-04-26 MED ORDER — GABAPENTIN 300 MG PO CAPS
300.0000 mg | ORAL_CAPSULE | Freq: Once | ORAL | Status: AC
  Administered 2015-04-26: 300 mg via ORAL
  Filled 2015-04-26: qty 1

## 2015-04-26 MED ORDER — OXYCODONE-ACETAMINOPHEN 5-325 MG PO TABS: 1.0000 | ORAL_TABLET | Freq: Four times a day (QID) | ORAL | Status: DC | PRN

## 2015-04-26 MED ORDER — OXYCODONE-ACETAMINOPHEN 5-325 MG PO TABS
2.0000 | ORAL_TABLET | Freq: Once | ORAL | Status: AC
  Administered 2015-04-26: 2 via ORAL
  Filled 2015-04-26: qty 2
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
## 2015-04-26 NOTE — ED Provider Notes (Addendum)
Patient returning today for ultrasound of lower extremities. It is positive for acute DVT. She was given a dose of Lovenox yesterday.She does have a past history of DVT during pregnancy. She has not been anticoagulated since then. Will start on xarelto. Needs to follow-up with PCP and possibly heme/onc. Has seen Dr Ouida SillsFagan previously, but not in the last two years. Resource list provided. Denies respiratory complaints. Continues to complain of leg pain although has already noticed some improvement of swelling. Given crutches yesterday as well as prescription for pain medication. Advised that symptoms will gradually, but steadily improve. Requesting something else for pain which was provided. Advised her to take the hydrocodone or the oxycodone, but not both. Return precautions were reiterated.    Raeford RazorStephen Gladstone Rosas, MD 04/26/15 [phone]

## 2015-04-26 NOTE — ED Notes (Signed)
Pt instructed on correct crutches use and was able to demonstrate proper use of crutches

## 2015-04-26 NOTE — ED Notes (Signed)
Pt given coke to drink 

## 2015-04-26 NOTE — Discharge Instructions (Signed)
You were seen today for left leg pain. This may be a combination of things. You do have evidence of a disc herniation and may have an element of sciatica. Given your history of blood clot and positive D-dimer test, blood clot is also consideration.  You were given a blood thinner in the ER. You need to return tomorrow for ultrasound imaging.

## 2015-04-26 NOTE — ED Notes (Signed)
Pt requesting zofran, Dr. Wilkie AyeHorton informed and orders given and carried out

## 2015-09-19 ENCOUNTER — Encounter (HOSPITAL_COMMUNITY): Payer: Self-pay | Admitting: Emergency Medicine

## 2015-09-19 ENCOUNTER — Emergency Department (HOSPITAL_COMMUNITY)
Admission: EM | Admit: 2015-09-19 | Discharge: 2015-09-19 | Disposition: A | Discharge status: Home / Self Care | Payer: Medicaid Other | Attending: Emergency Medicine | Admitting: Emergency Medicine

## 2015-09-19 DIAGNOSIS — F17210 Nicotine dependence, cigarettes, uncomplicated: Secondary | ICD-10-CM | POA: Insufficient documentation

## 2015-09-19 DIAGNOSIS — Z79899 Other long term (current) drug therapy: Secondary | ICD-10-CM | POA: Insufficient documentation

## 2015-09-19 DIAGNOSIS — I10 Essential (primary) hypertension: Secondary | ICD-10-CM | POA: Insufficient documentation

## 2015-09-19 DIAGNOSIS — M79605 Pain in left leg: Secondary | ICD-10-CM

## 2015-09-19 DIAGNOSIS — I82402 Acute embolism and thrombosis of unspecified deep veins of left lower extremity: Secondary | ICD-10-CM | POA: Insufficient documentation

## 2015-09-19 MED ORDER — PROMETHAZINE HCL 12.5 MG PO TABS
25.0000 mg | ORAL_TABLET | Freq: Once | ORAL | Status: AC
  Administered 2015-09-19: 25 mg via ORAL
  Filled 2015-09-19: qty 2

## 2015-09-19 MED ORDER — HYDROMORPHONE HCL 1 MG/ML IJ SOLN
1.0000 mg | Freq: Once | INTRAMUSCULAR | Status: AC
  Administered 2015-09-19: 1 mg via INTRAMUSCULAR
  Filled 2015-09-19: qty 1

## 2015-09-19 MED ORDER — OXYCODONE-ACETAMINOPHEN 5-325 MG PO TABS: 1.0000 | ORAL_TABLET | ORAL | 0 refills | Status: DC | PRN

## 2015-09-19 MED ORDER — DIAZEPAM 2 MG PO TABS
2.0000 mg | ORAL_TABLET | Freq: Once | ORAL | Status: AC
  Administered 2015-09-19: 2 mg via ORAL
  Filled 2015-09-19: qty 1

## 2015-09-19 MED ORDER — NAPROXEN 375 MG PO TABS: 375.0000 mg | ORAL_TABLET | Freq: Two times a day (BID) | ORAL | 0 refills | Status: DC | PRN

## 2015-09-19 MED ORDER — ENOXAPARIN SODIUM 150 MG/ML ~~LOC~~ SOLN
1.5000 mg/kg | Freq: Once | SUBCUTANEOUS | Status: AC
  Administered 2015-09-19: 135 mg via SUBCUTANEOUS
  Filled 2015-09-19: qty 1

## 2015-09-19 MED ORDER — KETOROLAC TROMETHAMINE 30 MG/ML IJ SOLN
15.0000 mg | Freq: Once | INTRAMUSCULAR | Status: AC
  Administered 2015-09-19: 15 mg via INTRAMUSCULAR
  Filled 2015-09-19: qty 1

## 2015-09-19 NOTE — ED Provider Notes (Signed)
[facility] DEPT Provider Note   CSN: [id number] Arrival date & time: 09/19/15  1355  First Provider Contact:  None       History   Chief Complaint Chief Complaint  Patient presents with  . Leg Pain    HPI [name] is a 40 y.o. female.  HPI   40 year old female with atraumatic left lower extremity pain and swelling. Onset on Friday and worsening since then. Pain and swelling is primarily foot and extensive interaction. Pain is significantly worse when her foot is dependent position. She denies any acute trauma. No fevers or chills. 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 Most recently, she was treated earlier this year with Xarelto. She did not complete the entire course of therapy secondary to financial constraints. From her description it sounds like she completed xarelto starter pack but did not have further meds. She has no chest pain or respiratory complaints.  Past Medical History:  Diagnosis Date  . Anemia   . Chronic back pain   . Hypertension   . PE (pulmonary embolism) 09/2004   while pregnant  . Sciatica     Patient Active Problem List   Diagnosis Date Noted  . Abscess of tonsil 05/23/2013    Past Surgical History:  Procedure Laterality Date  . CHOLECYSTECTOMY    . TUBAL LIGATION      OB History    No data available       Home Medications    Prior to Admission medications   Medication Sig Start Date End Date Taking? Authorizing Provider  clindamycin (CLEOCIN) 150 MG capsule Take 1 capsule (150 mg total) by mouth every 6 (six) hours. 06/02/14   Hope Orlene Och, NP  cyclobenzaprine (FLEXERIL) 10 MG tablet Take 1 tablet (10 mg total) by mouth 3 (three) times daily as needed for muscle spasms. 06/04/13   Devoria Albe, MD  ferrous sulfate 325 (65 FE) MG tablet Take 1 tablet (325 mg total) by mouth 3 (three) times daily with meals. 06/04/13   Devoria Albe, MD  HYDROcodone-acetaminophen (NORCO/VICODIN) 5-325 MG tablet Take 1-2 tablets by mouth every 4 (four)  hours as needed. 04/26/15   Raeford Razor, MD  methocarbamol (ROBAXIN) 500 MG tablet Take 2 tablets (1,000 mg total) by mouth 3 (three) times daily. 10/26/13   Ivery Quale, PA-C  methylPREDNISolone (MEDROL DOSEPAK) 4 MG TBPK tablet Take as directed on packet 04/26/15   Shon Baton, MD  naproxen (NAPROSYN) 500 MG tablet Take 1 tablet (500 mg total) by mouth 2 (two) times daily. 06/02/14   Hope Orlene Och, NP  oxyCODONE-acetaminophen (PERCOCET/ROXICET) 5-325 MG tablet Take 1-2 tablets by mouth every 6 (six) hours as needed for severe pain. 04/26/15   Shon Baton, MD  XARELTO STARTER PACK 15 & 20 MG TBPK Take 15-20 mg by mouth as directed. Take as directed on package: Start with one 15mg  tablet by mouth twice a day with food. On Day 22, switch to one 20mg  tablet once a day with food. 04/26/15   Raeford Razor, MD    Family History History reviewed. No pertinent family history.  Social History Social History  Substance Use Topics  . Smoking status: Current Every Day Smoker    Packs/day: 1.00    Types: Cigarettes  . Smokeless tobacco: Not on file  . Alcohol use Yes     Allergies   Penicillins   Review of Systems Review of Systems  All systems reviewed and negative, other than as noted  in HPI.  Physical Exam Updated Vital Signs BP 120/77 (BP Location: Left Arm)   Pulse 90   Temp 98 F (36.7 C) (Oral)   Resp 16   Ht  (1.676 m)   Wt 200 lb (90.7 kg)   LMP 09/19/2015   SpO2 100%   BMI 32.28 kg/m   Physical Exam  Constitutional: She appears well-developed and well-nourished. No distress.  HENT:  Head: Normocephalic and atraumatic.  Eyes: Conjunctivae are normal.  Neck: Neck supple.  Cardiovascular: Normal rate and regular rhythm.   No murmur heard. Pulmonary/Chest: Effort normal and breath sounds normal. No respiratory distress.  Abdominal: Soft. There is no tenderness.  Musculoskeletal: She exhibits edema.  Swelling of the left lower extremity. Asymmetric as  compared to the right. Swelling is from just below the knee distally into the foot. No  calf tenderness or palpable cords. She does have some faint ecchymosis over the anterior and lateral shin. She has mild tenderness to palpation around the ankle diffusely. No discrete point tenderness. Good DP pulse. Her foot is warm. Sensation is intact to light touch. She can actively range her ankle although with increased pain. There is no erythema or increased warmth as compared to the right.  Neurological: She is alert.  Skin: Skin is warm and dry.  Psychiatric: She has a normal mood and affect.  Nursing note and vitals reviewed.    ED Treatments / Results  Labs (all labs ordered are listed, but only abnormal results are displayed) Labs Reviewed - No data to display  EKG  EKG Interpretation None       Radiology No results found.  Procedures Procedures (including critical care time)  Medications Ordered in ED Medications  enoxaparin (LOVENOX) injection 135 mg (135 mg Subcutaneous Given 09/19/15 1541)  ketorolac (TORADOL) 30 MG/ML injection 15 mg (15 mg Intramuscular Given 09/19/15 1544)  HYDROmorphone (DILAUDID) injection 1 mg (1 mg Intramuscular Given 09/19/15 1544)  diazepam (VALIUM) tablet 2 mg (2 mg Oral Given 09/19/15 1541)  promethazine (PHENERGAN) tablet 25 mg (25 mg Oral Given 09/19/15 1540)     Initial Impression / Assessment and Plan / ED Course  I have reviewed the triage vital signs and the nursing notes.  Pertinent labs & imaging results that were available during my care of the patient were reviewed by me and considered in my medical decision making (see chart for details).  Clinical Course    40 year old female with atraumatic left lower extremity pain/swelling. Clinically she likely has a DVT. Has good pulses. No trauma. Exam not consistent with infectious process. She has a past history of venous thrombosis. She was on Xarelto previously but did not complete therapy  completely. Unfortunately, cannot obtain ultrasound at this time. Will empirically treat with Lovenox. As needed pain medication. She will need to return tomorrow for lower extremity ultrasound. She says she cannot walk but said the same on prior evaluation. Clinically not cerulea/phlegmasia.   Final Clinical Impressions(s) / ED Diagnoses   Final diagnoses:  DVT of leg (deep venous thrombosis), left    New Prescriptions New Prescriptions   No medications on file     Raeford Razor, MD 09/19/15 [phone]

## 2015-09-19 NOTE — ED Notes (Signed)
Pt states pain started "a few days ago", has hx of DVT. Stated she did not follow up with specialist after her blood thinner prescription ran out, is currently not on blood thinner. Reports she cannot bear weight on leg. Swelling, redness, and warmth noted to L ankle and foot. Denies SOB/CP.

## 2015-09-19 NOTE — ED Triage Notes (Signed)
Pt states she has hx of DVT and left leg has been hurting for the past few days.  States it feels the same and is not on blood thinner

## 2015-09-20 ENCOUNTER — Telehealth (HOSPITAL_COMMUNITY): Payer: Self-pay | Admitting: Emergency Medicine

## 2015-09-20 ENCOUNTER — Ambulatory Visit (HOSPITAL_COMMUNITY)
Admission: RE | Admit: 2015-09-20 | Discharge: 2015-09-20 | Disposition: A | Discharge status: Home / Self Care | Payer: Self-pay | Source: Ambulatory Visit | Attending: Emergency Medicine | Admitting: Emergency Medicine

## 2015-09-20 DIAGNOSIS — M79605 Pain in left leg: Secondary | ICD-10-CM | POA: Insufficient documentation

## 2015-09-20 DIAGNOSIS — I82432 Acute embolism and thrombosis of left popliteal vein: Secondary | ICD-10-CM | POA: Insufficient documentation

## 2015-09-20 DIAGNOSIS — I82499 Acute embolism and thrombosis of other specified deep vein of unspecified lower extremity: Secondary | ICD-10-CM | POA: Insufficient documentation

## 2015-09-20 NOTE — Telephone Encounter (Addendum)
Pt informed of results Had long discussion with patient regarding her use of anticoagulation She has requested coumadin / lovenox and will be given a Rx for all needed meds ( I called them into the pharmacy) - she is aware of risks of bleeding, and accepts those risks.  She was given list of follow up physicians - she is aware of how to give herself the lovenox and understands need of lab draw need for coumadin surveillance.  Pt changed her mind and wanted them called into the [facility] - I have done this

## 2016-07-19 ENCOUNTER — Emergency Department (HOSPITAL_COMMUNITY)
Admission: EM | Admit: 2016-07-19 | Discharge: 2016-07-19 | Disposition: A | Discharge status: Home / Self Care | Payer: Self-pay | Attending: Emergency Medicine | Admitting: Emergency Medicine

## 2016-07-19 ENCOUNTER — Encounter (HOSPITAL_COMMUNITY): Payer: Self-pay | Admitting: *Deleted

## 2016-07-19 DIAGNOSIS — Z79899 Other long term (current) drug therapy: Secondary | ICD-10-CM | POA: Insufficient documentation

## 2016-07-19 DIAGNOSIS — K2960 Other gastritis without bleeding: Secondary | ICD-10-CM | POA: Insufficient documentation

## 2016-07-19 DIAGNOSIS — G8929 Other chronic pain: Secondary | ICD-10-CM | POA: Insufficient documentation

## 2016-07-19 DIAGNOSIS — L02415 Cutaneous abscess of right lower limb: Secondary | ICD-10-CM

## 2016-07-19 DIAGNOSIS — F17210 Nicotine dependence, cigarettes, uncomplicated: Secondary | ICD-10-CM | POA: Insufficient documentation

## 2016-07-19 DIAGNOSIS — L03115 Cellulitis of right lower limb: Secondary | ICD-10-CM | POA: Insufficient documentation

## 2016-07-19 DIAGNOSIS — I10 Essential (primary) hypertension: Secondary | ICD-10-CM | POA: Insufficient documentation

## 2016-07-19 DIAGNOSIS — K2900 Acute gastritis without bleeding: Secondary | ICD-10-CM

## 2016-07-19 DIAGNOSIS — M545 Low back pain: Secondary | ICD-10-CM | POA: Insufficient documentation

## 2016-07-19 LAB — COMPREHENSIVE METABOLIC PANEL
ALBUMIN: 3.8 g/dL (ref 3.5–5.0)
ALK PHOS: 81 U/L (ref 38–126)
ALT: 12 U/L — ABNORMAL LOW (ref 14–54)
ANION GAP: 8 (ref 5–15)
AST: 15 U/L (ref 15–41)
BILIRUBIN TOTAL: 0.5 mg/dL (ref 0.3–1.2)
BUN: 8 mg/dL (ref 6–20)
CALCIUM: 8.9 mg/dL (ref 8.9–10.3)
CO2: 26 mmol/L (ref 22–32)
Chloride: 105 mmol/L (ref 101–111)
Creatinine, Ser: 0.68 mg/dL (ref 0.44–1.00)
GFR calc non Af Amer: >60 mL/min (ref >60)
GLUCOSE: 88 mg/dL (ref 65–99)
POTASSIUM: 3.4 mmol/L — AB (ref 3.5–5.1)
Sodium: 139 mmol/L (ref 135–145)
TOTAL PROTEIN: 7.7 g/dL (ref 6.5–8.1)

## 2016-07-19 LAB — URINALYSIS, ROUTINE W REFLEX MICROSCOPIC
Bilirubin Urine: NEGATIVE
GLUCOSE, UA: NEGATIVE mg/dL
Ketones, ur: NEGATIVE mg/dL
NITRITE: NEGATIVE
PH: 5 (ref 5.0–8.0)
PROTEIN: NEGATIVE mg/dL
SPECIFIC GRAVITY, URINE: 1.017 (ref 1.005–1.030)

## 2016-07-19 LAB — CBC WITH DIFFERENTIAL/PLATELET
BASOS PCT: 0 %
Basophils Absolute: 0 10*3/uL (ref 0.0–0.1)
EOS ABS: 0 10*3/uL (ref 0.0–0.7)
Eosinophils Relative: 1 %
HCT: 28.4 % — ABNORMAL LOW (ref 36.0–46.0)
Hemoglobin: 8.5 g/dL — ABNORMAL LOW (ref 12.0–15.0)
LYMPHS ABS: 1.6 10*3/uL (ref 0.7–4.0)
Lymphocytes Relative: 34 %
MCH: 22.5 pg — AB (ref 26.0–34.0)
MCHC: 29.9 g/dL — AB (ref 30.0–36.0)
MCV: 75.1 fL — ABNORMAL LOW (ref 78.0–100.0)
MONO ABS: 0.3 10*3/uL (ref 0.1–1.0)
MONOS PCT: 6 %
Neutro Abs: 2.9 10*3/uL (ref 1.7–7.7)
Neutrophils Relative %: 59 %
Platelets: 306 10*3/uL (ref 150–400)
RBC: 3.78 MIL/uL — ABNORMAL LOW (ref 3.87–5.11)
RDW: 18.2 % — AB (ref 11.5–15.5)
WBC: 4.9 10*3/uL (ref 4.0–10.5)

## 2016-07-19 LAB — PREGNANCY, URINE: PREG TEST UR: NEGATIVE

## 2016-07-19 LAB — LIPASE, BLOOD: LIPASE: 22 U/L (ref 11–51)

## 2016-07-19 LAB — TROPONIN I: Troponin I: <0.03 ng/mL (ref <0.03)

## 2016-07-19 MED ORDER — MORPHINE SULFATE (PF) 4 MG/ML IV SOLN
4.0000 mg | Freq: Once | INTRAVENOUS | Status: DC
  Filled 2016-07-19: qty 1

## 2016-07-19 MED ORDER — LIDOCAINE-EPINEPHRINE (PF) 2 %-1:200000 IJ SOLN
INTRAMUSCULAR | Status: AC
  Administered 2016-07-19: 20 mL
  Filled 2016-07-19: qty 20

## 2016-07-19 MED ORDER — GI COCKTAIL ~~LOC~~
30.0000 mL | Freq: Once | ORAL | Status: AC
  Administered 2016-07-19: 30 mL via ORAL
  Filled 2016-07-19: qty 30

## 2016-07-19 MED ORDER — OMEPRAZOLE 20 MG PO CPDR: 20.0000 mg | DELAYED_RELEASE_CAPSULE | Freq: Every day | ORAL | 0 refills | Status: DC

## 2016-07-19 MED ORDER — KETOROLAC TROMETHAMINE 30 MG/ML IJ SOLN
30.0000 mg | Freq: Once | INTRAMUSCULAR | Status: AC
  Administered 2016-07-19: 30 mg via INTRAMUSCULAR
  Filled 2016-07-19: qty 1

## 2016-07-19 MED ORDER — LIDOCAINE-EPINEPHRINE 2 %-1:100000 IJ SOLN
20.0000 mL | Freq: Once | INTRAMUSCULAR | Status: DC
  Filled 2016-07-19: qty 20

## 2016-07-19 MED ORDER — MORPHINE SULFATE (PF) 4 MG/ML IV SOLN
4.0000 mg | Freq: Once | INTRAVENOUS | Status: AC
  Administered 2016-07-19: 4 mg via INTRAMUSCULAR
  Filled 2016-07-19: qty 1

## 2016-07-19 MED ORDER — SULFAMETHOXAZOLE-TRIMETHOPRIM 800-160 MG PO TABS: 1.0000 | ORAL_TABLET | Freq: Two times a day (BID) | ORAL | 0 refills | Status: AC

## 2016-07-19 MED ORDER — LIDOCAINE-EPINEPHRINE-TETRACAINE (LET) SOLUTION
3.0000 mL | Freq: Once | NASAL | Status: AC
  Administered 2016-07-19: 3 mL via TOPICAL
  Filled 2016-07-19: qty 3

## 2016-07-19 NOTE — Discharge Instructions (Signed)
Please follow-up with your pain doctor as scheduled for ongoing management of your back pain.  Please take antibiotics as prescribed for your abscess and skin infection. You may want to spot treat your boils in the future with something OTC containing benzyl or salicylic acid.   Please avoid alcohol and ibuprofen while you have your abdominal pain. Avoid spicy or fatty foods. Eat bland diet until symptoms improve. Take acid inhibitor as prescribed for your symptoms.  Return for worsening symptoms, including fever, intractable vomiting, or any other symptoms concerning to you.

## 2016-07-19 NOTE — ED Triage Notes (Signed)
Pt reports having an abscess on the inside of her right thigh. Pt also reports intermittent chest pain and feeling that her heart in skipping a beat. Pt has upper back pain, nausea and numbness to right arm.

## 2016-07-19 NOTE — ED Provider Notes (Signed)
[facility] Provider Note   CSN: [id number] Arrival date & time: 07/19/16  1951     History   Chief Complaint Chief Complaint  Patient presents with  . Abscess    HPI [name] is a 41 y.o. female.  HPI 41 year old female who presents with multiple complaints. 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 Since 2 days ago, having epigastric burning, onset after eating. Feeling nausea but no vomiting. No fever or chills. Did drink alcohol prior. No significant recent NSAID usage. Also states since 2 days ago, enlarging boil over right inner thigh, painful to touch. Has not taken any medications for symptoms. Also reports feeling weak. No cough, difficulty breathing, but stated that there was a heaviness over left breast lasting for a few minutes, but now fully resolved. Noticed that her right arm briefly felt numb during this time from elbow down, but resolved after position changes. Chest pain not pleuritic, not associated with exertion. Also complaining of lower back pain, worse with movement, consistent with her chronic low back pain. No dysuria, or other urinary complaints.   Past Medical History:  Diagnosis Date  . Anemia   . Chronic back pain   . Hypertension   . PE (pulmonary embolism) 09/2004   while pregnant  . Sciatica     Patient Active Problem List   Diagnosis Date Noted  . Abscess of tonsil 05/23/2013    Past Surgical History:  Procedure Laterality Date  . CHOLECYSTECTOMY    . TUBAL LIGATION      OB History    No data available       Home Medications    Prior to Admission medications   Medication Sig Start Date End Date Taking? Authorizing Provider  clindamycin (CLEOCIN) 150 MG capsule Take 1 capsule (150 mg total) by mouth every 6 (six) hours. 06/02/14   Janne Napoleon, NP  cyclobenzaprine (FLEXERIL) 10 MG tablet Take 1 tablet (10 mg total) by mouth 3 (three) times daily as needed for muscle spasms.  06/04/13   Devoria Albe, MD  ferrous sulfate 325 (65 FE) MG tablet Take 1 tablet (325 mg total) by mouth 3 (three) times daily with meals. 06/04/13   Devoria Albe, MD  HYDROcodone-acetaminophen (NORCO/VICODIN) 5-325 MG tablet Take 1-2 tablets by mouth every 4 (four) hours as needed. 04/26/15   Raeford Razor, MD  methocarbamol (ROBAXIN) 500 MG tablet Take 2 tablets (1,000 mg total) by mouth 3 (three) times daily. 10/26/13   Ivery Quale, PA-C  methylPREDNISolone (MEDROL DOSEPAK) 4 MG TBPK tablet Take as directed on packet 04/26/15   Horton, Mayer Masker, MD  naproxen (NAPROSYN) 375 MG tablet Take 1 tablet (375 mg total) by mouth 2 (two) times daily as needed. 09/19/15   Raeford Razor, MD  omeprazole (PRILOSEC) 20 MG capsule Take 1 capsule (20 mg total) by mouth daily. 07/19/16   Lavera Guise, MD  oxyCODONE-acetaminophen (PERCOCET/ROXICET) 5-325 MG tablet Take 1 tablet by mouth every 4 (four) hours as needed for severe pain. 09/19/15   Raeford Razor, MD  sulfamethoxazole-trimethoprim (BACTRIM DS,SEPTRA DS) 800-160 MG tablet Take 1 tablet by mouth 2 (two) times daily. 07/19/16 07/26/16  Lavera Guise, MD  XARELTO STARTER PACK 15 & 20 MG TBPK Take 15-20 mg by mouth as directed. Take as directed on package: Start with one 15mg  tablet by mouth twice a day with food. On Day 22, switch to one 20mg  tablet once a day with food.  04/26/15   Raeford Razor, MD    Family History History reviewed. No pertinent family history.  Social History Social History  Substance Use Topics  . Smoking status: Current Every Day Smoker    Packs/day: 1.00    Types: Cigarettes  . Smokeless tobacco: Never Used  . Alcohol use Yes     Allergies   Penicillins   Review of Systems Review of Systems  Constitutional: Negative for fever.  Respiratory: Negative for cough and shortness of breath.   Gastrointestinal: Positive for abdominal pain and nausea. Negative for diarrhea and vomiting.  Musculoskeletal: Positive for back pain.  Skin:  Positive for wound (boil to right groin).  Allergic/Immunologic: Negative for immunocompromised state.  Hematological: Does not bruise/bleed easily.  Psychiatric/Behavioral: Negative for confusion.  All other systems reviewed and are negative.    Physical Exam Updated Vital Signs BP (!) 135/95   Pulse 75   Temp 98.2 F (36.8 C)   Resp 17   Ht 5\' 6"  (1.676 m)   Wt 85.3 kg (188 lb)   LMP 07/09/2016   SpO2 100%   BMI 30.34 kg/m   Physical Exam Physical Exam  Nursing note and vitals reviewed. Constitutional: Well developed, well nourished, non-toxic, and in no acute distress Head: Normocephalic and atraumatic.  Mouth/Throat: Oropharynx is clear and moist.  Neck: Normal range of motion. Neck supple.  Cardiovascular: Normal rate and regular rhythm.   Pulmonary/Chest: Effort normal and breath sounds normal.  Abdominal: Soft. There is epigastric tenderness. There is no rebound and no guarding.  Musculoskeletal: Normal range of motion.  Neurological: Alert, no facial droop, fluent speech, moves all extremities symmetrically Skin: Skin is warm and dry. area of of skin swelling, induration and underlying fluctuance measuring 2 x 2 cm over right inner thigh Psychiatric: Cooperative   ED Treatments / Results  Labs (all labs ordered are listed, but only abnormal results are displayed) Labs Reviewed  CBC WITH DIFFERENTIAL/PLATELET - Abnormal; Notable for the following:       Result Value   RBC 3.78 (*)    Hemoglobin 8.5 (*)    HCT 28.4 (*)    MCV 75.1 (*)    MCH 22.5 (*)    MCHC 29.9 (*)    RDW 18.2 (*)    All other components within normal limits  COMPREHENSIVE METABOLIC PANEL - Abnormal; Notable for the following:    Potassium 3.4 (*)    ALT 12 (*)    All other components within normal limits  URINALYSIS, ROUTINE W REFLEX MICROSCOPIC - Abnormal; Notable for the following:    APPearance HAZY (*)    Hgb urine dipstick MODERATE (*)    Leukocytes, UA SMALL (*)    Bacteria,  UA RARE (*)    Squamous Epithelial / LPF 6-30 (*)    All other components within normal limits  LIPASE, BLOOD  PREGNANCY, URINE  TROPONIN I    EKG  EKG Interpretation  Date/Time:  Wednesday Jul 19 2016 20:04:15 EDT Ventricular Rate:  89 PR Interval:  150 QRS Duration: 80 QT Interval:  378 QTC Calculation: 459 R Axis:   11 Text Interpretation:  Normal sinus rhythm Normal ECG no prior EKG normal EKG  Confirmed by Crista Curb [phone]) on 07/19/2016 9:57:05 PM       Radiology No results found.  Procedures .Marland KitchenIncision and Drainage Date/Time: 07/19/2016 10:00 PM Performed by: Crista Curb DUO Authorized by: Crista Curb DUO   Consent:    Consent obtained:  Verbal  Consent given by:  Patient   Risks discussed:  Bleeding, incomplete drainage and infection   Alternatives discussed:  No treatment Location:    Type:  Abscess   Size:  1.5 x 2.5   Location: right inner thigh. Pre-procedure details:    Skin preparation:  Betadine Anesthesia (see MAR for exact dosages):    Anesthesia method:  Topical application and local infiltration   Topical anesthetic:  LET   Local anesthetic:  Lidocaine 2% WITH epi Procedure type:    Complexity:  Simple Procedure details:    Needle aspiration: no     Incision types:  Single straight   Incision depth:  Dermal   Scalpel blade:  11   Wound management:  Probed and deloculated   Drainage:  Purulent and bloody   Drainage amount:  Moderate   Wound treatment:  Wound left open   Packing materials:  None Post-procedure details:    Patient tolerance of procedure:  Tolerated well, no immediate complications   (including critical care time) EMERGENCY DEPARTMENT US SOFT TISSUE INTERPRETATION "Study: Limited Soft Tissue Ultrasound"  INDICATIONS: Pain and Soft tissue infection Multiple views of the body part were obtained in real-time with a multi-frequency linear probe  PERFORMED BY: Myself IMAGES ARCHIVED?: No SIDE:Right  BODY PART:Lower  extremity INTERPRETATION:  Abcess present and Cellulitis present    Medications Ordered in ED Medications  lidocaine-EPINEPHrine (XYLOCAINE W/EPI) 2 %-1:100000 (with pres) injection 20 mL (not administered)  lidocaine-EPINEPHrine-tetracaine (LET) solution (3 mLs Topical Given 07/19/16 2105)  gi cocktail (Maalox,Lidocaine,Donnatal) (30 mLs Oral Given 07/19/16 2104)  morphine 4 MG/ML injection 4 mg (4 mg Intramuscular Given 07/19/16 2104)  lidocaine-EPINEPHrine (XYLOCAINE W/EPI) 2 %-1:200000 (PF) injection (20 mLs  Given by Other 07/19/16 2105)  ketorolac (TORADOL) 30 MG/ML injection 30 mg (30 mg Intramuscular Given 07/19/16 2217)     Initial Impression / Assessment and Plan / ED Course  I have reviewed the triage vital signs and the nursing notes.  Pertinent labs & imaging results that were available during my care of the patient were reviewed by me and considered in my medical decision making (see chart for details).     Presenting with multiple complaints. Well appearing. Non-toxic. With normal vitals.   Abscess to right inner thigh with surrounding cellulitis, drained at beside and will discharge with course of bactrim. No systemic signs or symptoms of illness.   Abdomen soft and benign. Suspect gastritis given etoh intake the night prior with epigastric burning now worsened by eating. Normal lipase. Liver test unremarkable. Given GI cocktail and discharge with supportive care and PPI.  Brief nonspecific episode of left chest pressure, fully resolved without respiratory symptoms. Low risk and atypical for ACS. Troponin normal and EKG without ischemia. Atypical symptoms for PE and she is not hypoxic, tachypneic or with tachycardia. Low risk for dissection and history not concerning for that either. Her brief episode of arm numbness from elbow down seems peripheral in nature, given that it went away with position change.   Back pain chronic. She is seeing pain doctor for first time on  Sunday. Will defer pain management to him.   The patient appears reasonably screened and/or stabilized for discharge and I doubt any other medical condition or other [facility] requiring further screening, evaluation, or treatment in the ED at this time prior to discharge.  Strict return and follow-up instructions reviewed. She expressed understanding of all discharge instructions and felt comfortable with the plan of care.   Final Clinical Impressions(s) /  ED Diagnoses   Final diagnoses:  Cellulitis and abscess of right leg  Other acute gastritis without hemorrhage  Chronic bilateral low back pain without sciatica    New Prescriptions New Prescriptions   OMEPRAZOLE (PRILOSEC) 20 MG CAPSULE    Take 1 capsule (20 mg total) by mouth daily.   SULFAMETHOXAZOLE-TRIMETHOPRIM (BACTRIM DS,SEPTRA DS) 800-160 MG TABLET    Take 1 tablet by mouth 2 (two) times daily.     Lavera Guise, MD 07/19/16 2228

## 2016-07-19 NOTE — ED Notes (Signed)
Dr Joni FearsLui at bedside for I&D

## 2016-07-19 NOTE — ED Notes (Signed)
Pt ambulatory to bathroom and back to room 

## 2016-09-05 ENCOUNTER — Emergency Department (HOSPITAL_COMMUNITY)
Admission: EM | Admit: 2016-09-05 | Discharge: 2016-09-05 | Disposition: A | Discharge status: Home / Self Care | Payer: Self-pay | Attending: Emergency Medicine | Admitting: Emergency Medicine

## 2016-09-05 ENCOUNTER — Encounter (HOSPITAL_COMMUNITY): Payer: Self-pay | Admitting: Emergency Medicine

## 2016-09-05 DIAGNOSIS — I10 Essential (primary) hypertension: Secondary | ICD-10-CM | POA: Insufficient documentation

## 2016-09-05 DIAGNOSIS — R519 Headache, unspecified: Secondary | ICD-10-CM

## 2016-09-05 DIAGNOSIS — R51 Headache: Secondary | ICD-10-CM | POA: Insufficient documentation

## 2016-09-05 DIAGNOSIS — K0889 Other specified disorders of teeth and supporting structures: Secondary | ICD-10-CM

## 2016-09-05 DIAGNOSIS — F17210 Nicotine dependence, cigarettes, uncomplicated: Secondary | ICD-10-CM | POA: Insufficient documentation

## 2016-09-05 DIAGNOSIS — Z7901 Long term (current) use of anticoagulants: Secondary | ICD-10-CM | POA: Insufficient documentation

## 2016-09-05 MED ORDER — BUPIVACAINE-EPINEPHRINE (PF) 0.5% -1:200000 IJ SOLN
10.0000 mL | Freq: Once | INTRAMUSCULAR | Status: AC
  Administered 2016-09-05: 10 mL
  Filled 2016-09-05: qty 30

## 2016-09-05 MED ORDER — CLINDAMYCIN HCL 150 MG PO CAPS
300.0000 mg | ORAL_CAPSULE | Freq: Once | ORAL | Status: AC
  Administered 2016-09-05: 300 mg via ORAL
  Filled 2016-09-05: qty 2

## 2016-09-05 MED ORDER — CLINDAMYCIN HCL 150 MG PO CAPS: 300.0000 mg | ORAL_CAPSULE | Freq: Four times a day (QID) | ORAL | 0 refills | Status: DC

## 2016-09-05 MED ORDER — TRAMADOL HCL 50 MG PO TABS: 50.0000 mg | ORAL_TABLET | Freq: Four times a day (QID) | ORAL | 0 refills | Status: DC | PRN

## 2016-09-05 MED ORDER — METOCLOPRAMIDE HCL 5 MG/ML IJ SOLN
10.0000 mg | Freq: Once | INTRAMUSCULAR | Status: AC
  Administered 2016-09-05: 10 mg via INTRAMUSCULAR
  Filled 2016-09-05: qty 2

## 2016-09-05 MED ORDER — KETOROLAC TROMETHAMINE 60 MG/2ML IM SOLN
60.0000 mg | Freq: Once | INTRAMUSCULAR | Status: AC
  Administered 2016-09-05: 60 mg via INTRAMUSCULAR
  Filled 2016-09-05: qty 2

## 2016-09-05 NOTE — ED Provider Notes (Signed)
[facility]-EMERGENCY DEPT Provider Note   CSN: [id number] Arrival date & time: 09/05/16  0005     History   Chief Complaint Chief Complaint  Patient presents with  . Headache  . Back Pain    HPI [name] is a 41 y.o. female.  Patient presents to the ER for evaluation of toothache. Patient experiencing severe constant pain in the left lower side of her mouth secondary to a fractured and decayed tooth. She has noticed facial swelling, inability to eat because of pain. It is causing her to have some nausea and headache. Headache is global and throbbing, more on the left side. No visual disturbance, numbness, tingling or weakness of extremities. She does notice some pain in her back.      Past Medical History:  Diagnosis Date  . Anemia   . Chronic back pain   . Hypertension   . PE (pulmonary embolism) 09/2004   while pregnant  . Sciatica     Patient Active Problem List   Diagnosis Date Noted  . Abscess of tonsil 05/23/2013    Past Surgical History:  Procedure Laterality Date  . CHOLECYSTECTOMY    . TUBAL LIGATION      OB History    No data available       Home Medications    Prior to Admission medications   Medication Sig Start Date End Date Taking? Authorizing Provider  clindamycin (CLEOCIN) 150 MG capsule Take 2 capsules (300 mg total) by mouth 4 (four) times daily. 09/05/16   Gilda CreasePollina, Christopher J, MD  cyclobenzaprine (FLEXERIL) 10 MG tablet Take 1 tablet (10 mg total) by mouth 3 (three) times daily as needed for muscle spasms. 06/04/13   Devoria AlbeKnapp, Iva, MD  ferrous sulfate 325 (65 FE) MG tablet Take 1 tablet (325 mg total) by mouth 3 (three) times daily with meals. 06/04/13   Devoria AlbeKnapp, Iva, MD  HYDROcodone-acetaminophen (NORCO/VICODIN) 5-325 MG tablet Take 1-2 tablets by mouth every 4 (four) hours as needed. 04/26/15   Raeford RazorKohut, Stephen, MD  methocarbamol (ROBAXIN) 500 MG tablet Take 2 tablets (1,000 mg total) by mouth 3 (three) times daily. 10/26/13   Ivery QualeBryant, Hobson, PA-C   methylPREDNISolone (MEDROL DOSEPAK) 4 MG TBPK tablet Take as directed on packet 04/26/15   Horton, Mayer Maskerourtney F, MD  naproxen (NAPROSYN) 375 MG tablet Take 1 tablet (375 mg total) by mouth 2 (two) times daily as needed. 09/19/15   Raeford RazorKohut, Stephen, MD  omeprazole (PRILOSEC) 20 MG capsule Take 1 capsule (20 mg total) by mouth daily. 07/19/16   Lavera GuiseLiu, Dana Duo, MD  oxyCODONE-acetaminophen (PERCOCET/ROXICET) 5-325 MG tablet Take 1 tablet by mouth every 4 (four) hours as needed for severe pain. 09/19/15   Raeford RazorKohut, Stephen, MD  traMADol (ULTRAM) 50 MG tablet Take 1 tablet (50 mg total) by mouth every 6 (six) hours as needed. 09/05/16   Pollina, Canary Brimhristopher J, MD  XARELTO STARTER PACK 15 & 20 MG TBPK Take 15-20 mg by mouth as directed. Take as directed on package: Start with one 15mg  tablet by mouth twice a day with food. On Day 22, switch to one 20mg  tablet once a day with food. 04/26/15   Raeford RazorKohut, Stephen, MD    Family History History reviewed. No pertinent family history.  Social History Social History  Substance Use Topics  . Smoking status: Current Every Day Smoker    Packs/day: 1.00    Types: Cigarettes  . Smokeless tobacco: Never Used  . Alcohol use Yes     Allergies  Penicillins   Review of Systems Review of Systems  HENT: Positive for dental problem and facial swelling.   Neurological: Positive for headaches.  All other systems reviewed and are negative.    Physical Exam Updated Vital Signs BP (!) 142/95 (BP Location: Left Arm)   Pulse 83   Temp 99.5 F (37.5 C) (Oral)   Resp 16   Ht 5\' 4"  (1.626 m)   Wt 81.6 kg (180 lb)   LMP 08/06/2016   SpO2 98%   BMI 30.90 kg/m   Physical Exam  Constitutional: She is oriented to person, place, and time. She appears well-developed and well-nourished. No distress.  HENT:  Head: Normocephalic and atraumatic.  Right Ear: Hearing normal.  Left Ear: Hearing normal.  Nose: Nose normal.  Mouth/Throat: Oropharynx is clear and moist and mucous  membranes are normal. Dental caries (Widespread) present.    Eyes: Pupils are equal, round, and reactive to light. Conjunctivae and EOM are normal.  Neck: Normal range of motion. Neck supple.  Cardiovascular: Regular rhythm, S1 normal and S2 normal.  Exam reveals no gallop and no friction rub.   No murmur heard. Pulmonary/Chest: Effort normal and breath sounds normal. No respiratory distress. She exhibits no tenderness.  Abdominal: Soft. Normal appearance and bowel sounds are normal. There is no hepatosplenomegaly. There is no tenderness. There is no rebound, no guarding, no tenderness at McBurney's point and negative Murphy's sign. No hernia.  Musculoskeletal: Normal range of motion.  Neurological: She is alert and oriented to person, place, and time. She has normal strength. No cranial nerve deficit or sensory deficit. Coordination normal. GCS eye subscore is 4. GCS verbal subscore is 5. GCS motor subscore is 6.  Skin: Skin is warm, dry and intact. No rash noted. No cyanosis.  Psychiatric: She has a normal mood and affect. Her speech is normal and behavior is normal. Thought content normal.  Nursing note and vitals reviewed.    ED Treatments / Results  Labs (all labs ordered are listed, but only abnormal results are displayed) Labs Reviewed - No data to display  EKG  EKG Interpretation None       Radiology No results found.  Procedures .Nerve Block Date/Time: 09/05/2016 1:32 AM Performed by: Gilda Crease Authorized by: Gilda Crease   Consent:    Consent obtained:  Verbal   Risks discussed:  Allergic reaction, infection and nerve damage Universal protocol:    Procedure explained and questions answered to patient or proxy's satisfaction: yes     Site marked: yes     Time out called: yes     Patient identity confirmed:  Verbally with patient Indications:    Indications:  Pain relief Location:    Body area:  Head   Head nerve blocked: inferior  alveolar.   Laterality:  Left Procedure details (see MAR for exact dosages):    Block needle gauge:  27 G   Anesthetic injected:  Bupivacaine 0.5% WITH epi   Steroid injected:  None   Additive injected:  None   Injection procedure:  Anatomic landmarks identified, incremental injection, negative aspiration for blood, introduced needle and anatomic landmarks palpated   Paresthesia:  None Post-procedure details:    Outcome:  Anesthesia achieved   Patient tolerance of procedure:  Tolerated well, no immediate complications   (including critical care time)  Medications Ordered in ED Medications  bupivacaine-epinephrine (MARCAINE W/ EPI) 0.5% -1:200000 injection 10 mL (not administered)  clindamycin (CLEOCIN) capsule 300 mg (not administered)  ketorolac (TORADOL) injection 60 mg (60 mg Intramuscular Given 09/05/16 0109)  metoCLOPramide (REGLAN) injection 10 mg (10 mg Intramuscular Given 09/05/16 0109)     Initial Impression / Assessment and Plan / ED Course  I have reviewed the triage vital signs and the nursing notes.  Pertinent labs & imaging results that were available during my care of the patient were reviewed by me and considered in my medical decision making (see chart for details).     Patient presents with severe dental pain and subjective facial swelling without obvious swelling or abscess on examination. This is causing headache. Patient was treated here with dental injection, medication for her headache and will be discharged with analgesia and antibiotics, follow-up with dentist.  Final Clinical Impressions(s) / ED Diagnoses   Final diagnoses:  Toothache  Bad headache    New Prescriptions New Prescriptions   CLINDAMYCIN (CLEOCIN) 150 MG CAPSULE    Take 2 capsules (300 mg total) by mouth 4 (four) times daily.   TRAMADOL (ULTRAM) 50 MG TABLET    Take 1 tablet (50 mg total) by mouth every 6 (six) hours as needed.     Gilda Crease, MD 09/05/16 [phone]

## 2016-09-05 NOTE — ED Notes (Signed)
ED Provider at bedside. 

## 2016-09-05 NOTE — ED Triage Notes (Signed)
Pt c/o headache, nausea, dental pain and back pain

## 2017-04-10 ENCOUNTER — Emergency Department (HOSPITAL_COMMUNITY)
Admission: EM | Admit: 2017-04-10 | Discharge: 2017-04-10 | Disposition: A | Discharge status: Home / Self Care | Payer: Self-pay | Attending: Emergency Medicine | Admitting: Emergency Medicine

## 2017-04-10 ENCOUNTER — Other Ambulatory Visit: Payer: Self-pay

## 2017-04-10 ENCOUNTER — Emergency Department (HOSPITAL_COMMUNITY): Payer: Self-pay

## 2017-04-10 ENCOUNTER — Encounter (HOSPITAL_COMMUNITY): Payer: Self-pay | Admitting: *Deleted

## 2017-04-10 ENCOUNTER — Ambulatory Visit (HOSPITAL_COMMUNITY): Admission: RE | Admit: 2017-04-10 | Payer: Self-pay | Source: Ambulatory Visit

## 2017-04-10 DIAGNOSIS — Z86711 Personal history of pulmonary embolism: Secondary | ICD-10-CM | POA: Insufficient documentation

## 2017-04-10 DIAGNOSIS — M7989 Other specified soft tissue disorders: Secondary | ICD-10-CM

## 2017-04-10 DIAGNOSIS — Z79899 Other long term (current) drug therapy: Secondary | ICD-10-CM | POA: Insufficient documentation

## 2017-04-10 DIAGNOSIS — F17210 Nicotine dependence, cigarettes, uncomplicated: Secondary | ICD-10-CM | POA: Insufficient documentation

## 2017-04-10 DIAGNOSIS — R2242 Localized swelling, mass and lump, left lower limb: Secondary | ICD-10-CM | POA: Insufficient documentation

## 2017-04-10 DIAGNOSIS — I10 Essential (primary) hypertension: Secondary | ICD-10-CM | POA: Insufficient documentation

## 2017-04-10 DIAGNOSIS — R600 Localized edema: Secondary | ICD-10-CM

## 2017-04-10 LAB — CBC
HEMATOCRIT: 29.4 % — AB (ref 36.0–46.0)
HEMOGLOBIN: 8.4 g/dL — AB (ref 12.0–15.0)
MCH: 22.3 pg — AB (ref 26.0–34.0)
MCHC: 28.6 g/dL — AB (ref 30.0–36.0)
MCV: 78 fL (ref 78.0–100.0)
Platelets: 306 10*3/uL (ref 150–400)
RBC: 3.77 MIL/uL — ABNORMAL LOW (ref 3.87–5.11)
RDW: 18.2 % — ABNORMAL HIGH (ref 11.5–15.5)
WBC: 6.7 10*3/uL (ref 4.0–10.5)

## 2017-04-10 LAB — D-DIMER, QUANTITATIVE: D-Dimer, Quant: 1.16 ug/mL-FEU — ABNORMAL HIGH (ref 0.00–0.50)

## 2017-04-10 LAB — BASIC METABOLIC PANEL
Anion gap: 8 (ref 5–15)
BUN: 9 mg/dL (ref 6–20)
CHLORIDE: 104 mmol/L (ref 101–111)
CO2: 27 mmol/L (ref 22–32)
CREATININE: 0.62 mg/dL (ref 0.44–1.00)
Calcium: 9.1 mg/dL (ref 8.9–10.3)
GFR calc Af Amer: >60 mL/min (ref >60)
GFR calc non Af Amer: >60 mL/min (ref >60)
Glucose, Bld: 108 mg/dL — ABNORMAL HIGH (ref 65–99)
POTASSIUM: 3.9 mmol/L (ref 3.5–5.1)
Sodium: 139 mmol/L (ref 135–145)

## 2017-04-10 MED ORDER — ENOXAPARIN SODIUM 80 MG/0.8ML ~~LOC~~ SOLN
80.0000 mg | Freq: Once | SUBCUTANEOUS | Status: AC
  Administered 2017-04-10: 06:00:00 80 mg via SUBCUTANEOUS
  Filled 2017-04-10: qty 0.8

## 2017-04-10 MED ORDER — IOPAMIDOL (ISOVUE-370) INJECTION 76%
100.0000 mL | Freq: Once | INTRAVENOUS | Status: AC | PRN
  Administered 2017-04-10: 100 mL via INTRAVENOUS

## 2017-04-10 MED ORDER — MORPHINE SULFATE (PF) 4 MG/ML IV SOLN
4.0000 mg | Freq: Once | INTRAVENOUS | Status: AC
  Administered 2017-04-10: 4 mg via INTRAVENOUS
  Filled 2017-04-10: qty 1

## 2017-04-10 MED ORDER — ONDANSETRON HCL 4 MG/2ML IJ SOLN
4.0000 mg | Freq: Once | INTRAMUSCULAR | Status: AC
  Administered 2017-04-10: 4 mg via INTRAVENOUS
  Filled 2017-04-10: qty 2

## 2017-04-10 NOTE — ED Notes (Signed)
Pt informed of need for urine sample. Pt asked for ginger ale and stated that she could not provide urine sample until she had some ginger ale. The RN Ron and myself informed pt that she could not have anything P/O until after her CT scan.

## 2017-04-10 NOTE — ED Notes (Signed)
Pt alert & oriented x4. Patient given discharge instructions, paperwork & prescription(s). Patient verbalized understanding. Pt left department in wheelchair escorted by staff. Pt left w/ no further questions. 

## 2017-04-10 NOTE — ED Triage Notes (Signed)
Pt c/o pain and swelling to left foot for the last couple of day; pt is also c/o back pain

## 2017-04-10 NOTE — ED Provider Notes (Signed)
[facility] EMERGENCY DEPARTMENT Provider Note   CSN: [id number] Arrival date & time: 04/10/17  0250     History   Chief Complaint Chief Complaint  Patient presents with  . Leg Swelling    HPI [name] is a 42 y.o. female.  Patient presents to the ER for evaluation of pain and swelling of her left foot and lower leg for 3 days.  Patient has not suffered any trauma.  She has a history of DVT and PE.  She has not currently on anticoagulation.  She has not had chest pain and is not short of breath, however, she does have a sharp pain in the middle of her back whenever she takes a deep breath.      Past Medical History:  Diagnosis Date  . Anemia   . Chronic back pain   . Hypertension   . PE (pulmonary embolism) 09/2004   while pregnant  . Sciatica     Patient Active Problem List   Diagnosis Date Noted  . Abscess of tonsil 05/23/2013    Past Surgical History:  Procedure Laterality Date  . CHOLECYSTECTOMY    . TUBAL LIGATION      OB History    No data available       Home Medications    Prior to Admission medications   Medication Sig Start Date End Date Taking? Authorizing Provider  clindamycin (CLEOCIN) 150 MG capsule Take 2 capsules (300 mg total) by mouth 4 (four) times daily. 09/05/16   Gilda Crease, MD  cyclobenzaprine (FLEXERIL) 10 MG tablet Take 1 tablet (10 mg total) by mouth 3 (three) times daily as needed for muscle spasms. 06/04/13   Devoria Albe, MD  ferrous sulfate 325 (65 FE) MG tablet Take 1 tablet (325 mg total) by mouth 3 (three) times daily with meals. 06/04/13   Devoria Albe, MD  HYDROcodone-acetaminophen (NORCO/VICODIN) 5-325 MG tablet Take 1-2 tablets by mouth every 4 (four) hours as needed. 04/26/15   Raeford Razor, MD  methocarbamol (ROBAXIN) 500 MG tablet Take 2 tablets (1,000 mg total) by mouth 3 (three) times daily. 10/26/13   Ivery Quale, PA-C  methylPREDNISolone (MEDROL DOSEPAK) 4 MG TBPK tablet Take as directed on packet  04/26/15   Horton, Mayer Masker, MD  naproxen (NAPROSYN) 375 MG tablet Take 1 tablet (375 mg total) by mouth 2 (two) times daily as needed. 09/19/15   Raeford Razor, MD  omeprazole (PRILOSEC) 20 MG capsule Take 1 capsule (20 mg total) by mouth daily. 07/19/16   Lavera Guise, MD  oxyCODONE-acetaminophen (PERCOCET/ROXICET) 5-325 MG tablet Take 1 tablet by mouth every 4 (four) hours as needed for severe pain. 09/19/15   Raeford Razor, MD  traMADol (ULTRAM) 50 MG tablet Take 1 tablet (50 mg total) by mouth every 6 (six) hours as needed. 09/05/16   Shiann Kam, Canary Brim, MD  XARELTO STARTER PACK 15 & 20 MG TBPK Take 15-20 mg by mouth as directed. Take as directed on package: Start with one 15mg  tablet by mouth twice a day with food. On Day 22, switch to one 20mg  tablet once a day with food. 04/26/15   Raeford Razor, MD    Family History History reviewed. No pertinent family history.  Social History Social History   Tobacco Use  . Smoking status: Current Every Day Smoker    Packs/day: 1.00    Types: Cigarettes  . Smokeless tobacco: Never Used  Substance Use Topics  . Alcohol use: Yes  . Drug use:  Yes    Types: Marijuana     Allergies   Penicillins   Review of Systems Review of Systems  Respiratory: Negative for shortness of breath.   Cardiovascular: Negative for chest pain.  Musculoskeletal: Positive for back pain.  All other systems reviewed and are negative.    Physical Exam Updated Vital Signs BP (!) 155/105 (BP Location: Left Arm)   Pulse 83   Temp 98.4 F (36.9 C) (Oral)   Resp 18   Ht 5\' 4"  (1.626 m)   Wt 81.6 kg (180 lb)   LMP 04/03/2017   SpO2 100%   BMI 30.90 kg/m   Physical Exam  Constitutional: She is oriented to person, place, and time. She appears well-developed and well-nourished. No distress.  HENT:  Head: Normocephalic and atraumatic.  Right Ear: Hearing normal.  Left Ear: Hearing normal.  Nose: Nose normal.  Mouth/Throat: Oropharynx is clear and moist  and mucous membranes are normal.  Eyes: Conjunctivae and EOM are normal. Pupils are equal, round, and reactive to light.  Neck: Normal range of motion. Neck supple.  Cardiovascular: Regular rhythm, S1 normal and S2 normal. Exam reveals no gallop and no friction rub.  No murmur heard. Pulmonary/Chest: Effort normal and breath sounds normal. No respiratory distress. She exhibits no tenderness.  Abdominal: Soft. Normal appearance and bowel sounds are normal. There is no hepatosplenomegaly. There is no tenderness. There is no rebound, no guarding, no tenderness at McBurney's point and negative Murphy's sign. No hernia.  Musculoskeletal: Normal range of motion.       Left lower leg: She exhibits tenderness and swelling.       Left foot: There is tenderness and swelling.  Neurological: She is alert and oriented to person, place, and time. She has normal strength. No cranial nerve deficit or sensory deficit. Coordination normal. GCS eye subscore is 4. GCS verbal subscore is 5. GCS motor subscore is 6.  Skin: Skin is warm, dry and intact. No rash noted. No cyanosis.  Psychiatric: She has a normal mood and affect. Her speech is normal and behavior is normal. Thought content normal.  Nursing note and vitals reviewed.    ED Treatments / Results  Labs (all labs ordered are listed, but only abnormal results are displayed) Labs Reviewed  CBC - Abnormal; Notable for the following components:      Result Value   RBC 3.77 (*)    Hemoglobin 8.4 (*)    HCT 29.4 (*)    MCH 22.3 (*)    MCHC 28.6 (*)    RDW 18.2 (*)    All other components within normal limits  BASIC METABOLIC PANEL - Abnormal; Notable for the following components:   Glucose, Bld 108 (*)    All other components within normal limits  D-DIMER, QUANTITATIVE (NOT AT [facility]) - Abnormal; Notable for the following components:   D-Dimer, Quant 1.16 (*)    All other components within normal limits  URINALYSIS, ROUTINE W REFLEX MICROSCOPIC     EKG  EKG Interpretation None       Radiology Ct Angio Chest Pe W Or Wo Contrast  Result Date: 04/10/2017 CLINICAL DATA:  Back pain and left foot swelling. History of pulmonary embolus. EXAM: CT ANGIOGRAPHY CHEST WITH CONTRAST TECHNIQUE: Multidetector CT imaging of the chest was performed using the standard protocol during bolus administration of intravenous contrast. Multiplanar CT image reconstructions and MIPs were obtained to evaluate the vascular anatomy. CONTRAST:  ISOVUE-370 IOPAMIDOL (ISOVUE-370) INJECTION 76% COMPARISON:  Chest  CT 06/04/2013.  No interval imaging available. FINDINGS: Cardiovascular: There are no filling defects within the pulmonary arteries to suggest pulmonary embolus. Heart is normal in size. Normal caliber thoracic aorta without dissection. Common origin of the left common carotid and brachiocephalic arteries, normal anatomic variant. No pericardial effusion. Mediastinum/Nodes: Moderate hiatal/paraesophageal hernia. No enlarged mediastinal or hilar lymph nodes. Visualized thyroid gland is normal. Lungs/Pleura: Minimal inferior right lower lobe scarring. No consolidation. No pulmonary edema. No pleural fluid. No pulmonary mass. Upper Abdomen: Stomach distended with ingested contents. Musculoskeletal: There are no acute or suspicious osseous abnormalities. Review of the MIP images confirms the above findings. IMPRESSION: 1. No pulmonary embolus or acute intrathoracic abnormality. 2. Moderate hiatal/paraesophageal hernia. Electronically Signed   By: Rubye OaksMelanie  Ehinger M.D.   On: 04/10/2017 05:47    Procedures Procedures (including critical care time)  Medications Ordered in ED Medications  enoxaparin (LOVENOX) injection 80 mg (not administered)  morphine 4 MG/ML injection 4 mg (4 mg Intravenous Given 04/10/17 0512)  ondansetron (ZOFRAN) injection 4 mg (4 mg Intravenous Given 04/10/17 0512)  iopamidol (ISOVUE-370) 76 % injection 100 mL (100 mLs Intravenous  Contrast Given 04/10/17 0524)     Initial Impression / Assessment and Plan / ED Course  I have reviewed the triage vital signs and the nursing notes.  Pertinent labs & imaging results that were available during my care of the patient were reviewed by me and considered in my medical decision making (see chart for details).     Patient presents for evaluation of pain and swelling of her left leg.  She has had DVT in this leg in the past.  She does have mild swelling noted on examination and her d-dimer is elevated.  Ultrasound is not available overnight.  She did, however, report pain in her back that was pleuritic in nature.  Her CT angiography, however, is negative.  Patient will be administered Lovenox, discharge and will return later today for ultrasound to rule out DVT.  Final Clinical Impressions(s) / ED Diagnoses   Final diagnoses:  Left leg swelling    ED Discharge Orders        Ordered    US Venous Img Lower Unilateral Left     04/10/17 [id number]       Gilda CreasePollina, Antoine Vandermeulen J, MD 04/10/17 [phone]

## 2018-05-17 ENCOUNTER — Encounter (HOSPITAL_COMMUNITY): Payer: Self-pay | Admitting: Emergency Medicine

## 2018-05-17 ENCOUNTER — Emergency Department (HOSPITAL_COMMUNITY)
Admission: EM | Admit: 2018-05-17 | Discharge: 2018-05-17 | Disposition: A | Discharge status: Home / Self Care | Payer: Self-pay | Attending: Emergency Medicine | Admitting: Emergency Medicine

## 2018-05-17 ENCOUNTER — Emergency Department (HOSPITAL_COMMUNITY): Payer: Self-pay

## 2018-05-17 ENCOUNTER — Other Ambulatory Visit: Payer: Self-pay

## 2018-05-17 DIAGNOSIS — R071 Chest pain on breathing: Secondary | ICD-10-CM | POA: Insufficient documentation

## 2018-05-17 DIAGNOSIS — Z79899 Other long term (current) drug therapy: Secondary | ICD-10-CM | POA: Insufficient documentation

## 2018-05-17 DIAGNOSIS — F17210 Nicotine dependence, cigarettes, uncomplicated: Secondary | ICD-10-CM | POA: Insufficient documentation

## 2018-05-17 DIAGNOSIS — I10 Essential (primary) hypertension: Secondary | ICD-10-CM | POA: Insufficient documentation

## 2018-05-17 DIAGNOSIS — M546 Pain in thoracic spine: Secondary | ICD-10-CM | POA: Insufficient documentation

## 2018-05-17 LAB — CBC WITH DIFFERENTIAL/PLATELET
ABS IMMATURE GRANULOCYTES: 0.02 10*3/uL (ref 0.00–0.07)
BASOS ABS: 0 10*3/uL (ref 0.0–0.1)
BASOS PCT: 0 %
Eosinophils Absolute: 0.1 10*3/uL (ref 0.0–0.5)
Eosinophils Relative: 2 %
HCT: 28.8 % — ABNORMAL LOW (ref 36.0–46.0)
HEMOGLOBIN: 8.8 g/dL — AB (ref 12.0–15.0)
Immature Granulocytes: 0 %
LYMPHS PCT: 36 %
Lymphs Abs: 2.4 10*3/uL (ref 0.7–4.0)
MCH: 24.6 pg — AB (ref 26.0–34.0)
MCHC: 30.6 g/dL (ref 30.0–36.0)
MCV: 80.7 fL (ref 80.0–100.0)
MONO ABS: 0.4 10*3/uL (ref 0.1–1.0)
Monocytes Relative: 5 %
NEUTROS ABS: 3.8 10*3/uL (ref 1.7–7.7)
NRBC: 0 % (ref 0.0–0.2)
Neutrophils Relative %: 57 %
PLATELETS: 313 10*3/uL (ref 150–400)
RBC: 3.57 MIL/uL — AB (ref 3.87–5.11)
RDW: 18.4 % — ABNORMAL HIGH (ref 11.5–15.5)
WBC: 6.7 10*3/uL (ref 4.0–10.5)

## 2018-05-17 LAB — BASIC METABOLIC PANEL
Anion gap: 5 (ref 5–15)
BUN: 8 mg/dL (ref 6–20)
CHLORIDE: 107 mmol/L (ref 98–111)
CO2: 27 mmol/L (ref 22–32)
Calcium: 8.6 mg/dL — ABNORMAL LOW (ref 8.9–10.3)
Creatinine, Ser: 0.83 mg/dL (ref 0.44–1.00)
GFR calc non Af Amer: >60 mL/min (ref >60)
Glucose, Bld: 117 mg/dL — ABNORMAL HIGH (ref 70–99)
Potassium: 3.3 mmol/L — ABNORMAL LOW (ref 3.5–5.1)
SODIUM: 139 mmol/L (ref 135–145)

## 2018-05-17 LAB — D-DIMER, QUANTITATIVE (NOT AT ARMC): D DIMER QUANT: 0.98 ug{FEU}/mL — AB (ref 0.00–0.50)

## 2018-05-17 LAB — TROPONIN I

## 2018-05-17 MED ORDER — POTASSIUM CHLORIDE CRYS ER 20 MEQ PO TBCR
40.0000 meq | EXTENDED_RELEASE_TABLET | Freq: Once | ORAL | Status: AC
  Administered 2018-05-17: 40 meq via ORAL
  Filled 2018-05-17: qty 2

## 2018-05-17 MED ORDER — IBUPROFEN 400 MG PO TABS
600.0000 mg | ORAL_TABLET | Freq: Once | ORAL | Status: AC
  Administered 2018-05-17: 600 mg via ORAL
  Filled 2018-05-17: qty 2

## 2018-05-17 MED ORDER — CYCLOBENZAPRINE HCL 10 MG PO TABS: 10.0000 mg | ORAL_TABLET | Freq: Three times a day (TID) | ORAL | 0 refills | Status: DC | PRN

## 2018-05-17 MED ORDER — IOHEXOL 350 MG/ML SOLN
75.0000 mL | Freq: Once | INTRAVENOUS | Status: AC | PRN
  Administered 2018-05-17: 08:00:00 via INTRAVENOUS

## 2018-05-17 MED ORDER — CYCLOBENZAPRINE HCL 10 MG PO TABS
10.0000 mg | ORAL_TABLET | Freq: Once | ORAL | Status: AC
  Administered 2018-05-17: 10 mg via ORAL
  Filled 2018-05-17: qty 1

## 2018-05-17 MED ORDER — NAPROXEN 375 MG PO TABS: 375.0000 mg | ORAL_TABLET | Freq: Two times a day (BID) | ORAL | 0 refills | Status: DC

## 2018-05-17 MED ORDER — HYDROMORPHONE HCL 1 MG/ML IJ SOLN
1.0000 mg | Freq: Once | INTRAMUSCULAR | Status: AC
  Administered 2018-05-17: 1 mg via INTRAVENOUS
  Filled 2018-05-17: qty 1

## 2018-05-17 NOTE — Discharge Instructions (Addendum)
If you develop worsening, recurrent, or continued back pain, numbness or weakness in the arms or legs, incontinence of your bowels or bladders, numbness of your buttocks, fever, abdominal pain, or any other new/concerning symptoms then return to the ER for evaluation.   You are being prescribed naproxen.  Do not take any other NSAIDs such as ibuprofen, Aleve, aspirin, Motrin, Naprosyn, Advil, etc.  You are being prescribed Flexeril which can cause you to have different side effect such as dizziness, sleepiness, etc.  Do not drive while taking this medication or operate heavy machinery.  Do not use with any other medicines or alcohol.

## 2018-05-17 NOTE — ED Provider Notes (Signed)
[facility] EMERGENCY DEPARTMENT Provider Note   CSN: [id number] Arrival date & time: 05/17/18  7829    History   Chief Complaint Chief Complaint  Patient presents with  . Back Pain    HPI [name] is a 43 y.o. female.     HPI  43 year old female presents with atraumatic left upper back pain.  States it started all of a sudden last night.  It hurts to breathe and the pain is severe.  She tried some ibuprofen and some of her mom's Flexeril which partially helped a little bit.  However the pain currently is severe.  It radiates down her left arm and she has a little bit of pain in her left upper chest.  She thought her right lower leg was transiently swollen a couple days ago but this is resolved.  She had a DVT a few years ago but is not currently on anticoagulation.  The pain is so bad it causes her to have some shortness of breath.  There is no cough or fever.  Past Medical History:  Diagnosis Date  . Anemia   . Chronic back pain   . Hypertension   . PE (pulmonary embolism) 09/2004   while pregnant  . Sciatica     Patient Active Problem List   Diagnosis Date Noted  . Abscess of tonsil 05/23/2013    Past Surgical History:  Procedure Laterality Date  . CHOLECYSTECTOMY    . TUBAL LIGATION       OB History   No obstetric history on file.      Home Medications    Prior to Admission medications   Medication Sig Start Date End Date Taking? Authorizing Provider  clindamycin (CLEOCIN) 150 MG capsule Take 2 capsules (300 mg total) by mouth 4 (four) times daily. 09/05/16   Gilda Crease, MD  cyclobenzaprine (FLEXERIL) 10 MG tablet Take 1 tablet (10 mg total) by mouth 3 (three) times daily as needed for muscle spasms. 05/17/18   Pricilla Loveless, MD  ferrous sulfate 325 (65 FE) MG tablet Take 1 tablet (325 mg total) by mouth 3 (three) times daily with meals. 06/04/13   Devoria Albe, MD  HYDROcodone-acetaminophen (NORCO/VICODIN) 5-325 MG tablet Take 1-2 tablets  by mouth every 4 (four) hours as needed. 04/26/15   Raeford Razor, MD  methocarbamol (ROBAXIN) 500 MG tablet Take 2 tablets (1,000 mg total) by mouth 3 (three) times daily. 10/26/13   Ivery Quale, PA-C  methylPREDNISolone (MEDROL DOSEPAK) 4 MG TBPK tablet Take as directed on packet 04/26/15   Horton, Mayer Masker, MD  naproxen (NAPROSYN) 375 MG tablet Take 1 tablet (375 mg total) by mouth 2 (two) times daily with a meal. 05/17/18   Pricilla Loveless, MD  omeprazole (PRILOSEC) 20 MG capsule Take 1 capsule (20 mg total) by mouth daily. 07/19/16   Lavera Guise, MD  oxyCODONE-acetaminophen (PERCOCET/ROXICET) 5-325 MG tablet Take 1 tablet by mouth every 4 (four) hours as needed for severe pain. 09/19/15   Raeford Razor, MD  traMADol (ULTRAM) 50 MG tablet Take 1 tablet (50 mg total) by mouth every 6 (six) hours as needed. 09/05/16   Gilda Crease, MD    Family History History reviewed. No pertinent family history.  Social History Social History   Tobacco Use  . Smoking status: Current Every Day Smoker    Packs/day: 1.00    Types: Cigarettes  . Smokeless tobacco: Never Used  Substance Use Topics  . Alcohol use: Yes  .  Drug use: Yes    Types: Marijuana     Allergies   Penicillins   Review of Systems Review of Systems  Constitutional: Negative for fever.  Respiratory: Positive for shortness of breath. Negative for cough.   Cardiovascular: Positive for chest pain.  Musculoskeletal: Positive for back pain.  All other systems reviewed and are negative.    Physical Exam Updated Vital Signs BP (!) 134/101 (BP Location: Right Arm)   Pulse 94   Temp 98.4 F (36.9 C) (Oral)   Resp 18   Ht 5\' 6"  (1.676 m)   Wt 81.2 kg   LMP 05/14/2018   SpO2 98%   BMI 28.89 kg/m   Physical Exam Vitals signs and nursing note reviewed.  Constitutional:      Appearance: She is well-developed.  HENT:     Head: Normocephalic and atraumatic.     Right Ear: External ear normal.     Left Ear:  External ear normal.     Nose: Nose normal.  Eyes:     General:        Right eye: No discharge.        Left eye: No discharge.  Cardiovascular:     Rate and Rhythm: Normal rate and regular rhythm.     Pulses:          Radial pulses are 2+ on the left side.     Heart sounds: Normal heart sounds.  Pulmonary:     Effort: Pulmonary effort is normal.     Breath sounds: Normal breath sounds.  Abdominal:     Palpations: Abdomen is soft.     Tenderness: There is no abdominal tenderness.  Musculoskeletal:     Left shoulder: She exhibits decreased range of motion. She exhibits no tenderness.     Cervical back: She exhibits tenderness.       Back:     Right lower leg: No edema.     Left lower leg: No edema.     Comments: No overlying skin changes though the patient is very sensitive to minimal palpation to her left upper thoracic back. Decreased range of motion of shoulder appears to be related to pain in her back.  There is no bony shoulder tenderness. Normal strength in her left hand grip.  Skin:    General: Skin is warm and dry.  Neurological:     Mental Status: She is alert.  Psychiatric:        Mood and Affect: Mood is not anxious.      ED Treatments / Results  Labs (all labs ordered are listed, but only abnormal results are displayed) Labs Reviewed  BASIC METABOLIC PANEL - Abnormal; Notable for the following components:      Result Value   Potassium 3.3 (*)    Glucose, Bld 117 (*)    Calcium 8.6 (*)    All other components within normal limits  CBC WITH DIFFERENTIAL/PLATELET - Abnormal; Notable for the following components:   RBC 3.57 (*)    Hemoglobin 8.8 (*)    HCT 28.8 (*)    MCH 24.6 (*)    RDW 18.4 (*)    All other components within normal limits  D-DIMER, QUANTITATIVE (NOT AT [facility]) - Abnormal; Notable for the following components:   D-Dimer, Quant 0.98 (*)    All other components within normal limits  TROPONIN I    EKG EKG Interpretation  Date/Time:   Friday May 17 2018 08:12:54 EDT Ventricular Rate:  82 PR  Interval:    QRS Duration: 89 QT Interval:  389 QTC Calculation: 455 R Axis:   -3 Text Interpretation:  Normal sinus rhythm Left ventricular hypertrophy Borderline T abnormalities, anterior leads no significant change since 2018 Confirmed by Pricilla Loveless [id number]) on 05/17/2018 8:15:20 AM   Radiology Dg Chest 2 View  Result Date: 05/17/2018 CLINICAL DATA:  Chest pain EXAM: CHEST - 2 VIEW COMPARISON:  Chest radiograph Jun 21, 2007 and chest CT February 192019 FINDINGS: There is no edema or consolidation. There is slight left base atelectasis. Heart size and pulmonary vascularity are normal. No adenopathy. There is a focal hiatal hernia. No bone lesions. IMPRESSION: Slight left base atelectasis. No edema or consolidation. Heart size within normal limits. There is a focal hiatal hernia. Electronically Signed   By: Bretta Bang III M.D.   On: 05/17/2018 07:54   Ct Angio Chest Pe W/cm &/or Wo Cm  Result Date: 05/17/2018 CLINICAL DATA:  Chest pain EXAM: CT ANGIOGRAPHY CHEST WITH CONTRAST TECHNIQUE: Multidetector CT imaging of the chest was performed using the standard protocol during bolus administration of intravenous contrast. Multiplanar CT image reconstructions and MIPs were obtained to evaluate the vascular anatomy. CONTRAST:  75 mL OMNIPAQUE IOHEXOL 350 MG/ML SOLN COMPARISON:  Chest CT April 10, 2017 and chest radiograph May 17, 2018 FINDINGS: Cardiovascular: There is no demonstrable pulmonary embolus. There is no thoracic aortic aneurysm or dissection. Visualized great vessels appear unremarkable. Note that the right innominate and left common carotid arteries arise as a common trunk, an anatomic variant. There is no pericardial effusion or pericardial thickening. Mediastinum/Nodes: Visualized thyroid appears unremarkable. There is no appreciable thoracic adenopathy. There is a focal hiatal hernia. Lungs/Pleura: Areas of patchy  atelectatic change noted, primarily in the lower lobes. There is no evident edema or consolidation. There is no appreciable pleural effusion or pleural thickening. Upper Abdomen: Gallbladder absent. Visualized upper abdominal structures otherwise unremarkable. Musculoskeletal: There are no blastic or lytic bone lesions. No evident chest wall lesions. Review of the MIP images confirms the above findings. IMPRESSION: 1. No demonstrable pulmonary embolus. No thoracic aortic aneurysm or dissection. 2. Areas of patchy atelectasis bilaterally. No edema or consolidation. 3.  Moderate hiatal hernia. 4.  No appreciable thoracic adenopathy. 5.  Gallbladder absent. Electronically Signed   By: Bretta Bang III M.D.   On: 05/17/2018 08:47    Procedures Procedures (including critical care time)  Medications Ordered in ED Medications  potassium chloride SA (K-DUR,KLOR-CON) CR tablet 40 mEq (has no administration in time range)  ibuprofen (ADVIL,MOTRIN) tablet 600 mg (has no administration in time range)  cyclobenzaprine (FLEXERIL) tablet 10 mg (has no administration in time range)  HYDROmorphone (DILAUDID) injection 1 mg (1 mg Intravenous Given 05/17/18 0734)  iohexol (OMNIPAQUE) 350 MG/ML injection 75 mL ( Intravenous Contrast Given 05/17/18 0824)     Initial Impression / Assessment and Plan / ED Course  I have reviewed the triage vital signs and the nursing notes.  Pertinent labs & imaging results that were available during my care of the patient were reviewed by me and considered in my medical decision making (see chart for details).        Given patient's past DVT history, work-up for PE obtained.  This is negative.  Other labs are near baseline with mild hypokalemia and stable/chronic anemia.  CT otherwise unremarkable.  Pain is much improved.  We discussed using NSAIDs and I will prescribe a muscle relaxer as this is most likely muscular pain.  Have  low suspicion for septic joint or infection.  No  weakness or numbness on exam or by history.  We discussed return precautions but she appears stable for discharge home.  Final Clinical Impressions(s) / ED Diagnoses   Final diagnoses:  Acute left-sided thoracic back pain    ED Discharge Orders         Ordered    naproxen (NAPROSYN) 375 MG tablet  2 times daily with meals     05/17/18 0900    cyclobenzaprine (FLEXERIL) 10 MG tablet  3 times daily PRN     05/17/18 0900           Pricilla Loveless, MD 05/17/18 [phone]

## 2018-05-17 NOTE — ED Triage Notes (Signed)
Pt c/o left sided shoulder blade/back pain starting at 2000 last night worsened when she inhales, pt reports she has had multiple pains since last Sunday including headaches, neck and ear pain, and leg swelling that has subsided; pt reports her menstrual cycle started Tuesday and she has felt "really heavy down there"

## 2018-07-14 ENCOUNTER — Other Ambulatory Visit: Payer: Self-pay

## 2018-07-14 ENCOUNTER — Emergency Department (HOSPITAL_COMMUNITY)
Admission: EM | Admit: 2018-07-14 | Discharge: 2018-07-14 | Disposition: A | Discharge status: Home / Self Care | Payer: Self-pay | Attending: Emergency Medicine | Admitting: Emergency Medicine

## 2018-07-14 ENCOUNTER — Encounter (HOSPITAL_COMMUNITY): Payer: Self-pay | Admitting: *Deleted

## 2018-07-14 DIAGNOSIS — K0889 Other specified disorders of teeth and supporting structures: Secondary | ICD-10-CM | POA: Insufficient documentation

## 2018-07-14 DIAGNOSIS — F17210 Nicotine dependence, cigarettes, uncomplicated: Secondary | ICD-10-CM | POA: Insufficient documentation

## 2018-07-14 DIAGNOSIS — Z79899 Other long term (current) drug therapy: Secondary | ICD-10-CM | POA: Insufficient documentation

## 2018-07-14 DIAGNOSIS — I10 Essential (primary) hypertension: Secondary | ICD-10-CM | POA: Insufficient documentation

## 2018-07-14 MED ORDER — OXYCODONE-ACETAMINOPHEN 5-325 MG PO TABS: 1.0000 | ORAL_TABLET | ORAL | 0 refills | Status: DC | PRN

## 2018-07-14 MED ORDER — CLINDAMYCIN HCL 150 MG PO CAPS
150.0000 mg | ORAL_CAPSULE | Freq: Four times a day (QID) | ORAL | 0 refills | Status: DC
Start: 2018-07-14 — End: 2018-09-07

## 2018-07-14 MED ORDER — OXYCODONE-ACETAMINOPHEN 5-325 MG PO TABS
1.0000 | ORAL_TABLET | Freq: Once | ORAL | Status: AC
  Administered 2018-07-14: 1 via ORAL
  Filled 2018-07-14: qty 1

## 2018-07-14 MED ORDER — CLINDAMYCIN HCL 150 MG PO CAPS
150.0000 mg | ORAL_CAPSULE | Freq: Once | ORAL | Status: AC
  Administered 2018-07-14: 150 mg via ORAL
  Filled 2018-07-14: qty 1

## 2018-07-14 NOTE — ED Triage Notes (Signed)
Pt c/o right lower tooth pain x 2 days ago; pt states she has not been able to eat or sleep

## 2018-07-14 NOTE — ED Notes (Signed)
Pt ambulatory to waiting room. Pt verbalized understanding of discharge instructions.   

## 2018-07-14 NOTE — ED Provider Notes (Signed)
[facility] EMERGENCY DEPARTMENT Provider Note   CSN: [id number] Arrival date & time: 07/14/18  2694    History   Chief Complaint Chief Complaint  Patient presents with  . Dental Pain    HPI [name] is a 43 y.o. female.   The history is provided by the patient.  She has history of chronic back pain, hypertension, pulmonary embolism and comes in because of dental pain.  She states that a right lower molar broke off 3 days ago, and she has been having constant pain since then.  She has tried taking ibuprofen, BC powder, packing the area, warm compresses and nothing gives her anything more than very slight relief.  She rates pain a 10/10.  She is unable to eat and drink because of the pain.  She is unable to sleep because of the pain.  Past Medical History:  Diagnosis Date  . Anemia   . Chronic back pain   . Hypertension   . PE (pulmonary embolism) 09/2004   while pregnant  . Sciatica     Patient Active Problem List   Diagnosis Date Noted  . Abscess of tonsil 05/23/2013    Past Surgical History:  Procedure Laterality Date  . CHOLECYSTECTOMY    . TUBAL LIGATION       OB History   No obstetric history on file.      Home Medications    Prior to Admission medications   Medication Sig Start Date End Date Taking? Authorizing Provider  clindamycin (CLEOCIN) 150 MG capsule Take 2 capsules (300 mg total) by mouth 4 (four) times daily. 09/05/16   Gilda Crease, MD  cyclobenzaprine (FLEXERIL) 10 MG tablet Take 1 tablet (10 mg total) by mouth 3 (three) times daily as needed for muscle spasms. 05/17/18   Pricilla Loveless, MD  ferrous sulfate 325 (65 FE) MG tablet Take 1 tablet (325 mg total) by mouth 3 (three) times daily with meals. 06/04/13   Devoria Albe, MD  HYDROcodone-acetaminophen (NORCO/VICODIN) 5-325 MG tablet Take 1-2 tablets by mouth every 4 (four) hours as needed. 04/26/15   Raeford Razor, MD  methocarbamol (ROBAXIN) 500 MG tablet Take 2 tablets (1,000 mg  total) by mouth 3 (three) times daily. 10/26/13   Ivery Quale, PA-C  methylPREDNISolone (MEDROL DOSEPAK) 4 MG TBPK tablet Take as directed on packet 04/26/15   Horton, Mayer Masker, MD  naproxen (NAPROSYN) 375 MG tablet Take 1 tablet (375 mg total) by mouth 2 (two) times daily with a meal. 05/17/18   Pricilla Loveless, MD  omeprazole (PRILOSEC) 20 MG capsule Take 1 capsule (20 mg total) by mouth daily. 07/19/16   Lavera Guise, MD  oxyCODONE-acetaminophen (PERCOCET/ROXICET) 5-325 MG tablet Take 1 tablet by mouth every 4 (four) hours as needed for severe pain. 09/19/15   Raeford Razor, MD  traMADol (ULTRAM) 50 MG tablet Take 1 tablet (50 mg total) by mouth every 6 (six) hours as needed. 09/05/16   Gilda Crease, MD    Family History History reviewed. No pertinent family history.  Social History Social History   Tobacco Use  . Smoking status: Current Every Day Smoker    Packs/day: 1.00    Types: Cigarettes  . Smokeless tobacco: Never Used  Substance Use Topics  . Alcohol use: Yes  . Drug use: Yes    Types: Marijuana     Allergies   Penicillins   Review of Systems Review of Systems  All other systems reviewed and are negative.  Physical Exam Updated Vital Signs BP (!) 141/103 (BP Location: Right Arm)   Pulse 93   Temp 98.8 F (37.1 C) (Oral)   Resp 18   Ht 5\' 6"  (1.676 m)   Wt 77.1 kg   LMP 07/13/2018   SpO2 100%   BMI 27.44 kg/m   Physical Exam Vitals signs and nursing note reviewed.    43 year old female, resting comfortably and in no acute distress. Vital signs are significant for elevated blood pressure. Oxygen saturation is 100%, which is normal. Head is normocephalic and atraumatic. PERRLA, EOMI. Oropharynx is clear.  Teeth #30, 31, 32 are absent.  The socket where tooth #31 was is somewhat swollen and very tender.  I do not see any remnant of a tooth there. Neck is nontender and supple without adenopathy or JVD. Back is nontender and there is no CVA  tenderness. Lungs are clear without rales, wheezes, or rhonchi. Chest is nontender. Heart has regular rate and rhythm without murmur. Abdomen is soft, flat, nontender without masses or hepatosplenomegaly and peristalsis is normoactive. Extremities have no cyanosis or edema, full range of motion is present. Skin is warm and dry without rash. Neurologic: Mental status is normal, cranial nerves are intact, there are no motor or sensory deficits.  ED Treatments / Results   Procedures Procedures  Medications Ordered in ED Medications  clindamycin (CLEOCIN) capsule 150 mg (has no administration in time range)  oxyCODONE-acetaminophen (PERCOCET/ROXICET) 5-325 MG per tablet 1 tablet (has no administration in time range)     Initial Impression / Assessment and Plan / ED Course  I have reviewed the triage vital signs and the nursing notes.  Dental pain following fracture of tooth.  I suspect there might be some retained root.  This will need to be addressed by a dentist.  This is explained to the patient.  She is discharged with prescriptions for clindamycin and oxycodone-acetaminophen.  Old records are reviewed, and she has several ED visits for dental pain, but those were always involving tooth number 17.  Final Clinical Impressions(s) / ED Diagnoses   Final diagnoses:  Pain, dental  Elevated blood pressure reading with diagnosis of hypertension    ED Discharge Orders         Ordered    clindamycin (CLEOCIN) 150 MG capsule  4 times daily     07/14/18 0133    oxyCODONE-acetaminophen (PERCOCET) 5-325 MG tablet  Every 4 hours PRN     07/14/18 0140    oxyCODONE-acetaminophen (PERCOCET/ROXICET) 5-325 MG tablet  Every 4 hours PRN     07/14/18 0140           Dione BoozeGlick, Dorcus Riga, MD 07/14/18 [phone]

## 2018-07-14 NOTE — Discharge Instructions (Signed)
Take ibuprofen (maximum 12 tablets a day) or naproxen (two tablets twice a day).  Take acetaminophen as needed for less severe pain. Do not take acetaminophen and oxycodone-acetaminophen within four hours of each other.

## 2018-07-16 MED FILL — Oxycodone w/ Acetaminophen Tab 5-325 MG: ORAL | Qty: 6 | Status: AC

## 2018-09-07 ENCOUNTER — Encounter (HOSPITAL_COMMUNITY): Payer: Self-pay | Admitting: *Deleted

## 2018-09-07 ENCOUNTER — Other Ambulatory Visit: Payer: Self-pay

## 2018-09-07 ENCOUNTER — Emergency Department (HOSPITAL_COMMUNITY): Payer: Self-pay

## 2018-09-07 ENCOUNTER — Emergency Department (HOSPITAL_COMMUNITY)
Admission: EM | Admit: 2018-09-07 | Discharge: 2018-09-07 | Disposition: A | Discharge status: Home / Self Care | Payer: Self-pay | Attending: Emergency Medicine | Admitting: Emergency Medicine

## 2018-09-07 DIAGNOSIS — M546 Pain in thoracic spine: Secondary | ICD-10-CM | POA: Insufficient documentation

## 2018-09-07 DIAGNOSIS — R6883 Chills (without fever): Secondary | ICD-10-CM | POA: Insufficient documentation

## 2018-09-07 DIAGNOSIS — R197 Diarrhea, unspecified: Secondary | ICD-10-CM | POA: Insufficient documentation

## 2018-09-07 DIAGNOSIS — Z79899 Other long term (current) drug therapy: Secondary | ICD-10-CM | POA: Insufficient documentation

## 2018-09-07 DIAGNOSIS — Z86711 Personal history of pulmonary embolism: Secondary | ICD-10-CM | POA: Insufficient documentation

## 2018-09-07 DIAGNOSIS — R202 Paresthesia of skin: Secondary | ICD-10-CM | POA: Insufficient documentation

## 2018-09-07 DIAGNOSIS — R0602 Shortness of breath: Secondary | ICD-10-CM | POA: Insufficient documentation

## 2018-09-07 DIAGNOSIS — M549 Dorsalgia, unspecified: Secondary | ICD-10-CM

## 2018-09-07 DIAGNOSIS — R091 Pleurisy: Secondary | ICD-10-CM | POA: Insufficient documentation

## 2018-09-07 DIAGNOSIS — I10 Essential (primary) hypertension: Secondary | ICD-10-CM | POA: Insufficient documentation

## 2018-09-07 DIAGNOSIS — Z880 Allergy status to penicillin: Secondary | ICD-10-CM | POA: Insufficient documentation

## 2018-09-07 DIAGNOSIS — F17210 Nicotine dependence, cigarettes, uncomplicated: Secondary | ICD-10-CM | POA: Insufficient documentation

## 2018-09-07 LAB — CBC WITH DIFFERENTIAL/PLATELET
Abs Immature Granulocytes: 0.01 10*3/uL (ref 0.00–0.07)
Basophils Absolute: 0 10*3/uL (ref 0.0–0.1)
Basophils Relative: 0 %
Eosinophils Absolute: 0.1 10*3/uL (ref 0.0–0.5)
Eosinophils Relative: 1 %
HCT: 31.4 % — ABNORMAL LOW (ref 36.0–46.0)
Hemoglobin: 9.5 g/dL — ABNORMAL LOW (ref 12.0–15.0)
Immature Granulocytes: 0 %
Lymphocytes Relative: 21 %
Lymphs Abs: 1.4 10*3/uL (ref 0.7–4.0)
MCH: 23.4 pg — ABNORMAL LOW (ref 26.0–34.0)
MCHC: 30.3 g/dL (ref 30.0–36.0)
MCV: 77.3 fL — ABNORMAL LOW (ref 80.0–100.0)
Monocytes Absolute: 0.3 10*3/uL (ref 0.1–1.0)
Monocytes Relative: 4 %
Neutro Abs: 5.1 10*3/uL (ref 1.7–7.7)
Neutrophils Relative %: 74 %
Platelets: 310 10*3/uL (ref 150–400)
RBC: 4.06 MIL/uL (ref 3.87–5.11)
RDW: 17.7 % — ABNORMAL HIGH (ref 11.5–15.5)
WBC: 6.9 10*3/uL (ref 4.0–10.5)
nRBC: 0 % (ref 0.0–0.2)

## 2018-09-07 LAB — BASIC METABOLIC PANEL
Anion gap: 13 (ref 5–15)
BUN: 8 mg/dL (ref 6–20)
CO2: 21 mmol/L — ABNORMAL LOW (ref 22–32)
Calcium: 9.5 mg/dL (ref 8.9–10.3)
Chloride: 105 mmol/L (ref 98–111)
Creatinine, Ser: 0.68 mg/dL (ref 0.44–1.00)
GFR calc Af Amer: >60 mL/min (ref >60)
GFR calc non Af Amer: >60 mL/min (ref >60)
Glucose, Bld: 126 mg/dL — ABNORMAL HIGH (ref 70–99)
Potassium: 3.3 mmol/L — ABNORMAL LOW (ref 3.5–5.1)
Sodium: 139 mmol/L (ref 135–145)

## 2018-09-07 LAB — D-DIMER, QUANTITATIVE: D-Dimer, Quant: 1.03 ug/mL-FEU — ABNORMAL HIGH (ref 0.00–0.50)

## 2018-09-07 LAB — CK: Total CK: 118 U/L (ref 38–234)

## 2018-09-07 MED ORDER — KETOROLAC TROMETHAMINE 30 MG/ML IJ SOLN
30.0000 mg | Freq: Once | INTRAMUSCULAR | Status: AC
  Administered 2018-09-07: 30 mg via INTRAVENOUS
  Filled 2018-09-07: qty 1

## 2018-09-07 MED ORDER — LOPERAMIDE HCL 2 MG PO CAPS
4.0000 mg | ORAL_CAPSULE | Freq: Once | ORAL | Status: AC
  Administered 2018-09-07: 4 mg via ORAL
  Filled 2018-09-07: qty 2

## 2018-09-07 MED ORDER — SODIUM CHLORIDE 0.9 % IV BOLUS
1000.0000 mL | Freq: Once | INTRAVENOUS | Status: AC
  Administered 2018-09-07: 1000 mL via INTRAVENOUS

## 2018-09-07 MED ORDER — OXYCODONE-ACETAMINOPHEN 5-325 MG PO TABS
1.0000 | ORAL_TABLET | Freq: Once | ORAL | Status: AC
  Administered 2018-09-07: 1 via ORAL
  Filled 2018-09-07: qty 1

## 2018-09-07 MED ORDER — ONDANSETRON HCL 4 MG/2ML IJ SOLN
4.0000 mg | Freq: Once | INTRAMUSCULAR | Status: AC
  Administered 2018-09-07: 4 mg via INTRAVENOUS
  Filled 2018-09-07: qty 2

## 2018-09-07 MED ORDER — IOHEXOL 350 MG/ML SOLN
100.0000 mL | Freq: Once | INTRAVENOUS | Status: AC | PRN
  Administered 2018-09-07: 100 mL via INTRAVENOUS

## 2018-09-07 MED ORDER — MELOXICAM 7.5 MG PO TABS: 7.5000 mg | ORAL_TABLET | Freq: Two times a day (BID) | ORAL | 0 refills | Status: AC | PRN

## 2018-09-07 NOTE — ED Notes (Signed)
Pt transported to CT ?

## 2018-09-07 NOTE — ED Notes (Signed)
Pt stated her back and legs still hurts and wanted to know what edp was going to do about it. EDP aware.

## 2018-09-07 NOTE — ED Notes (Signed)
Pt returned from radiology. Pt c/o pain in legs and back, states she has a headache, & is concerned about BP. EDP notified.

## 2018-09-07 NOTE — ED Provider Notes (Signed)
[facility] Provider Note   CSN: [id number] Arrival date & time: 09/07/18  0428    History   Chief Complaint Chief Complaint  Patient presents with  . Emesis    HPI [name] is a 43 y.o. female.     HPI  This is a 43 year old female with a history of hypertension, PE who presents with multiple complaints.  Patient reports several day history of nausea, vomiting, and diarrhea.  She reports nonbilious, nonbloody emesis and watery diarrhea.  Additionally she reports chills.  No documented fevers.  She denies any chest pain but states that when she takes a deep breath she has some upper back pain.  She also reports tingling in the bilateral lower extremities.  She denies any sick contacts or COVID exposures.  She denies any abdominal pain.  Status post tubal ligation.  She does not think she could be pregnant.  Patient took Pepto-Bismol and Aleve with some relief.  Past Medical History:  Diagnosis Date  . Anemia   . Chronic back pain   . Hypertension   . PE (pulmonary embolism) 09/2004   while pregnant  . Sciatica     Patient Active Problem List   Diagnosis Date Noted  . Abscess of tonsil 05/23/2013    Past Surgical History:  Procedure Laterality Date  . CHOLECYSTECTOMY    . TUBAL LIGATION       OB History   No obstetric history on file.      Home Medications    Prior to Admission medications   Medication Sig Start Date End Date Taking? Authorizing Provider  clindamycin (CLEOCIN) 150 MG capsule Take 1 capsule (150 mg total) by mouth 4 (four) times daily. 07/14/18   Dione BoozeGlick, David, MD  ferrous sulfate 325 (65 FE) MG tablet Take 1 tablet (325 mg total) by mouth 3 (three) times daily with meals. 06/04/13   Devoria AlbeKnapp, Iva, MD  HYDROcodone-acetaminophen (NORCO/VICODIN) 5-325 MG tablet Take 1-2 tablets by mouth every 4 (four) hours as needed. 04/26/15   Raeford RazorKohut, Stephen, MD  omeprazole (PRILOSEC) 20 MG capsule Take 1 capsule (20 mg total) by mouth daily.  07/19/16   Lavera GuiseLiu, Dana Duo, MD  oxyCODONE-acetaminophen (PERCOCET) 5-325 MG tablet Take 1 tablet by mouth every 4 (four) hours as needed for moderate pain. 07/14/18   Dione BoozeGlick, David, MD  oxyCODONE-acetaminophen (PERCOCET/ROXICET) 5-325 MG tablet Take 1 tablet by mouth every 4 (four) hours as needed for severe pain. 07/14/18   Dione BoozeGlick, David, MD    Family History History reviewed. No pertinent family history.  Social History Social History   Tobacco Use  . Smoking status: Current Every Day Smoker    Packs/day: 1.00    Types: Cigarettes  . Smokeless tobacco: Never Used  Substance Use Topics  . Alcohol use: Yes  . Drug use: Yes    Types: Marijuana     Allergies   Penicillins   Review of Systems Review of Systems  Constitutional: Positive for chills. Negative for fever.  Respiratory: Positive for shortness of breath. Negative for cough.   Cardiovascular: Negative for chest pain and leg swelling.  Gastrointestinal: Positive for diarrhea, nausea and vomiting. Negative for abdominal pain and blood in stool.  Genitourinary: Negative for dysuria.  Musculoskeletal: Positive for back pain.  Skin: Negative for rash.  All other systems reviewed and are negative.    Physical Exam Updated Vital Signs BP (!) 149/84   Pulse 64   Temp 98.5 F (36.9 C) (Oral)  Resp 16   Ht 1.676 m (5\' 6" )   Wt 77.1 kg   SpO2 99%   BMI 27.44 kg/m   Physical Exam Vitals signs and nursing note reviewed.  Constitutional:      Appearance: She is well-developed. She is not ill-appearing.  HENT:     Head: Normocephalic and atraumatic.     Mouth/Throat:     Mouth: Mucous membranes are moist.  Eyes:     Pupils: Pupils are equal, round, and reactive to light.  Neck:     Musculoskeletal: Neck supple.  Cardiovascular:     Rate and Rhythm: Normal rate and regular rhythm.     Heart sounds: Normal heart sounds.  Pulmonary:     Effort: Pulmonary effort is normal. No respiratory distress.     Breath sounds:  No wheezing.  Abdominal:     General: Bowel sounds are normal.     Palpations: Abdomen is soft.     Tenderness: There is no abdominal tenderness. There is no guarding or rebound.  Musculoskeletal:     Right lower leg: No edema.     Left lower leg: No edema.  Skin:    General: Skin is warm and dry.  Neurological:     Mental Status: She is alert and oriented to person, place, and time.  Psychiatric:        Mood and Affect: Mood normal.      ED Treatments / Results  Labs (all labs ordered are listed, but only abnormal results are displayed) Labs Reviewed  CBC WITH DIFFERENTIAL/PLATELET - Abnormal; Notable for the following components:      Result Value   Hemoglobin 9.5 (*)    HCT 31.4 (*)    MCV 77.3 (*)    MCH 23.4 (*)    RDW 17.7 (*)    All other components within normal limits  BASIC METABOLIC PANEL - Abnormal; Notable for the following components:   Potassium 3.3 (*)    CO2 21 (*)    Glucose, Bld 126 (*)    All other components within normal limits  D-DIMER, QUANTITATIVE (NOT AT [facility]) - Abnormal; Notable for the following components:   D-Dimer, Quant 1.03 (*)    All other components within normal limits  CK  URINALYSIS, ROUTINE W REFLEX MICROSCOPIC  PREGNANCY, URINE    EKG EKG Interpretation  Date/Time:  Saturday September 07 2018 05:09:57 EDT Ventricular Rate:  76 PR Interval:    QRS Duration: 87 QT Interval:  414 QTC Calculation: 466 R Axis:   42 Text Interpretation:  Sinus rhythm Borderline T abnormalities, anterior leads Confirmed by Ross MarcusHorton, Cereniti Curb ([id number]) on 09/07/2018 5:24:01 AM   Radiology No results found.  Procedures Procedures (including critical care time)  Angiocath insertion Performed by: Shon Batonourtney F Ka Bench  Consent: Verbal consent obtained. Risks and benefits: risks, benefits and alternatives were discussed Time out: Immediately prior to procedure a "time out" was called to verify the correct patient, procedure, equipment, support staff and  site/side marked as required.  Preparation: Patient was prepped and draped in the usual sterile fashion.  Vein Location: right basilic  Ultrasound Guided  Gauge: 20  Normal blood return and flush without difficulty Patient tolerance: Patient tolerated the procedure well with no immediate complications.     Medications Ordered in ED Medications  sodium chloride 0.9 % bolus 1,000 mL (0 mLs Intravenous Stopped 09/07/18 0645)  ondansetron (ZOFRAN) injection 4 mg (4 mg Intravenous Given 09/07/18 0527)  ketorolac (TORADOL) 30 MG/ML injection 30  mg (30 mg Intravenous Given 09/07/18 0614)  loperamide (IMODIUM) capsule 4 mg (4 mg Oral Given 09/07/18 0629)  oxyCODONE-acetaminophen (PERCOCET/ROXICET) 5-325 MG per tablet 1 tablet (1 tablet Oral Given 09/07/18 0737)     Initial Impression / Assessment and Plan / ED Course  I have reviewed the triage vital signs and the nursing notes.  Pertinent labs & imaging results that were available during my care of the patient were reviewed by me and considered in my medical decision making (see chart for details).        Patient presents with multiple complaints.  She presents with nausea, vomiting, diarrhea, back pain, shortness of breath.  History of PE.  She is overall nontoxic-appearing and vital signs are reassuring.  EKG shows no signs of arrhythmia or ischemia.  She is not hypoxic.  Patient was given fluids, Zofran, Imodium.  Lab work obtained.  Initial lab work is reassuring.  D-dimer was sent.  Her prior PE was in the setting of pregnancy.  D-dimer is elevated.  Will obtain CTA.  At time of signout, CTA and urinalysis pending.  Final Clinical Impressions(s) / ED Diagnoses   Final diagnoses:  None    ED Discharge Orders    None       Tobey Lippard, Barbette Hair, MD 09/07/18 [phone]

## 2018-09-07 NOTE — ED Triage Notes (Signed)
Pt c/o body aches with most pain in her legs; pt also c/o n/v/d x 2 days

## 2018-09-07 NOTE — ED Notes (Signed)
Pt refusing in and out  Pt states she will pee in a little bit

## 2018-09-07 NOTE — ED Notes (Signed)
Pt reminded a urine sample is needed. Pt will notify staff when one can be obtained.

## 2018-09-07 NOTE — ED Provider Notes (Signed)
    I have examined and interviewed the patient after results of come back, she states that she feels better, she is still having the pain with breathing on her left upper back however the CT scan reveals no signs of acute findings.  At this time the patient is stable for discharge, she was informed that she will be started on an anti-inflammatory and she is agreeable to the plan.  She has no nausea at this time   Noemi Chapel, MD 09/07/18 1037

## 2018-09-07 NOTE — Discharge Instructions (Signed)
Thankfully your testing today does not show any signs of blood clot, all of your test seem to be normal, you are able to be discharged and follow-up with your family doctor.  If you do not have a family doctor see the list below.  Please take Mobic as needed twice a day for pain, do not take any more than this and do not take this medication with ibuprofen or Aleve or aspirin.  [facility] Primary Care Doctor List    Sinda Du MD. Specialty: Pulmonary Disease Contact information: [address]  [place] [place] [address]  [phone]   Tula Nakayama, MD. Specialty: [facility] Medicine Contact information: [address], [address] [place] [address]  [phone]   Sallee Lange, MD. Specialty: [facility] Medicine Contact information: [place]  [place] [address]  [phone]   Rosita Fire, MD Specialty: Internal Medicine Contact information: [place] [address]  [phone]   Delphina Cahill, MD. Specialty: Internal Medicine Contact information: [place] [address]  [phone]    [facility] (Dr. Maudie Mercury) Specialty: Family Medicine Contact information: [place] [address]  [phone]   Leslie Andrea, MD. Specialty: [facility] Medicine Contact information: [place] [address]  [phone]   Asencion Noble, MD. Specialty: Internal Medicine Contact information: [address]  [place] [address]  [place]  [address] [place],  [address] [phone]  Services [facility] offers a variety of basic health services.  Services include but are not limited to: Blood pressure checks  Heart rate checks  Blood sugar checks  Urine analysis  Rapid strep tests  Pregnancy tests.  Health education and referrals  People  needing more complex services will be directed to a physician online. Using these virtual visits, doctors can evaluate and prescribe medicine and treatments. There will be no medication on-site, though [facility] will help patients fill their prescriptions at little to no cost.   For More information please go to: [email or web address]

## 2018-09-07 NOTE — ED Notes (Signed)
EKG done and seen by Dr Dina Rich. Patient on 12 lead.

## 2018-09-11 ENCOUNTER — Other Ambulatory Visit: Payer: Self-pay

## 2018-09-11 ENCOUNTER — Emergency Department (HOSPITAL_COMMUNITY)
Admission: EM | Admit: 2018-09-11 | Discharge: 2018-09-11 | Disposition: A | Discharge status: Home / Self Care | Payer: Self-pay

## 2018-10-13 ENCOUNTER — Other Ambulatory Visit: Payer: Self-pay

## 2018-10-13 ENCOUNTER — Encounter (HOSPITAL_COMMUNITY): Payer: Self-pay | Admitting: Emergency Medicine

## 2018-10-13 ENCOUNTER — Emergency Department (HOSPITAL_COMMUNITY)
Admission: EM | Admit: 2018-10-13 | Discharge: 2018-10-13 | Disposition: A | Discharge status: Home / Self Care | Payer: Self-pay | Attending: Emergency Medicine | Admitting: Emergency Medicine

## 2018-10-13 DIAGNOSIS — I10 Essential (primary) hypertension: Secondary | ICD-10-CM | POA: Insufficient documentation

## 2018-10-13 DIAGNOSIS — W4904XA Ring or other jewelry causing external constriction, initial encounter: Secondary | ICD-10-CM | POA: Insufficient documentation

## 2018-10-13 DIAGNOSIS — S60450A Superficial foreign body of right index finger, initial encounter: Secondary | ICD-10-CM | POA: Insufficient documentation

## 2018-10-13 DIAGNOSIS — F17210 Nicotine dependence, cigarettes, uncomplicated: Secondary | ICD-10-CM | POA: Insufficient documentation

## 2018-10-13 DIAGNOSIS — Y939 Activity, unspecified: Secondary | ICD-10-CM | POA: Insufficient documentation

## 2018-10-13 DIAGNOSIS — Y929 Unspecified place or not applicable: Secondary | ICD-10-CM | POA: Insufficient documentation

## 2018-10-13 DIAGNOSIS — Y999 Unspecified external cause status: Secondary | ICD-10-CM | POA: Insufficient documentation

## 2018-10-13 NOTE — ED Triage Notes (Signed)
Patient states that her right 1st finger is swelling. She states she can't get ring off and wants to cut it off.

## 2018-10-13 NOTE — Discharge Instructions (Addendum)
You may apply ice on and off to your finger to help reduce swelling today.  Return to the ER if needed.  Be sure to take your blood pressure medications as directed

## 2018-10-13 NOTE — ED Provider Notes (Signed)
[facility] Provider Note   CSN: [id number] Arrival date & time: 10/13/18  [id number]     History   Chief Complaint Chief Complaint  Patient presents with  . Hand Problem    HPI [name] is a 43 y.o. female.     HPI   [name] is a 43 y.o. female who presents to the Emergency Department requesting removal of a ring from her right index finger.  She states that she woke this morning with swelling to her fingers and noticed that her ring on her index finger was tight.  She tried to remove the ring without success.  She has applied ice to her finger and butter also without relief.  She denies pain, numbness or discoloration of the finger.  No injury.  She does admit that she has not taken her blood pressure medications in a few days and that her medication has a diuretic component.    Past Medical History:  Diagnosis Date  . Anemia   . Chronic back pain   . Hypertension   . PE (pulmonary embolism) 09/2004   while pregnant  . Sciatica     Patient Active Problem List   Diagnosis Date Noted  . Abscess of tonsil 05/23/2013    Past Surgical History:  Procedure Laterality Date  . CHOLECYSTECTOMY    . TUBAL LIGATION       OB History   No obstetric history on file.      Home Medications    Prior to Admission medications   Not on File    Family History No family history on file.  Social History Social History   Tobacco Use  . Smoking status: Current Every Day Smoker    Packs/day: 1.00    Types: Cigarettes  . Smokeless tobacco: Never Used  Substance Use Topics  . Alcohol use: Yes  . Drug use: Yes    Types: Marijuana     Allergies   Penicillins   Review of Systems Review of Systems  Constitutional: Negative for chills and fever.  Musculoskeletal: Positive for joint swelling. Negative for arthralgias.       Swelling of the right index finger  Skin: Negative for color change and wound.  Neurological: Negative for  weakness and numbness.     Physical Exam Updated Vital Signs BP (!) 136/94 (BP Location: Right Arm)   Pulse 88   Temp 98.8 F (37.1 C) (Oral)   Resp 16   Ht 5\' 4"  (1.626 m)   Wt 77.1 kg   LMP 10/13/2018   SpO2 96%   BMI 29.18 kg/m   Physical Exam Vitals signs and nursing note reviewed.  Constitutional:      Appearance: Normal appearance.  HENT:     Head: Normocephalic.  Neck:     Thyroid: No thyromegaly.     Meningeal: Kernig's sign absent.  Cardiovascular:     Rate and Rhythm: Normal rate and regular rhythm.     Pulses: Normal pulses.  Pulmonary:     Effort: Pulmonary effort is normal.     Breath sounds: Normal breath sounds. No wheezing.  Musculoskeletal: Normal range of motion.        General: Swelling present.     Comments: Mild to moderate edema noted to the right index finger.  There is 2 rings present on the finger the most distal ring is causing some constriction.  Patient does have full range of motion of the finger.  Cap refill  is less than 2 seconds.  Extremity is warm and pink.  Skin:    General: Skin is warm.     Capillary Refill: Capillary refill takes less than 2 seconds.     Findings: No erythema or rash.  Neurological:     General: No focal deficit present.     Mental Status: She is alert.      ED Treatments / Results  Labs (all labs ordered are listed, but only abnormal results are displayed) Labs Reviewed - No data to display  EKG None  Radiology No results found.  Procedures Procedures (including critical care time)  Medications Ordered in ED Medications - No data to display   Initial Impression / Assessment and Plan / ED Course  I have reviewed the triage vital signs and the nursing notes.  Pertinent labs & imaging results that were available during my care of the patient were reviewed by me and considered in my medical decision making (see chart for details).        Jewelry has been removed from the right index finger  using a ring cutter, procedure performed by nursing staff.  On recheck, digit remains neurovascularly intact.  No open wounds.  Cap refill is less than 2 seconds.  Final Clinical Impressions(s) / ED Diagnoses   Final diagnoses:  Ring or other jewelry causing external constriction, initial encounter    ED Discharge Orders    None       Kem Parkinson, PA-C 10/13/18 1041    Milton Ferguson, MD 10/14/18 1621

## 2019-01-16 ENCOUNTER — Emergency Department (HOSPITAL_COMMUNITY)
Admission: EM | Admit: 2019-01-16 | Discharge: 2019-01-16 | Disposition: A | Discharge status: Home / Self Care | Payer: Self-pay | Attending: Emergency Medicine | Admitting: Emergency Medicine

## 2019-01-16 ENCOUNTER — Other Ambulatory Visit: Payer: Self-pay

## 2019-01-16 ENCOUNTER — Emergency Department (HOSPITAL_COMMUNITY): Payer: Self-pay

## 2019-01-16 ENCOUNTER — Encounter (HOSPITAL_COMMUNITY): Payer: Self-pay

## 2019-01-16 DIAGNOSIS — S93401A Sprain of unspecified ligament of right ankle, initial encounter: Secondary | ICD-10-CM | POA: Insufficient documentation

## 2019-01-16 DIAGNOSIS — X58XXXA Exposure to other specified factors, initial encounter: Secondary | ICD-10-CM | POA: Insufficient documentation

## 2019-01-16 DIAGNOSIS — F17210 Nicotine dependence, cigarettes, uncomplicated: Secondary | ICD-10-CM | POA: Insufficient documentation

## 2019-01-16 DIAGNOSIS — I10 Essential (primary) hypertension: Secondary | ICD-10-CM | POA: Insufficient documentation

## 2019-01-16 DIAGNOSIS — Y929 Unspecified place or not applicable: Secondary | ICD-10-CM | POA: Insufficient documentation

## 2019-01-16 DIAGNOSIS — Y999 Unspecified external cause status: Secondary | ICD-10-CM | POA: Insufficient documentation

## 2019-01-16 DIAGNOSIS — Y939 Activity, unspecified: Secondary | ICD-10-CM | POA: Insufficient documentation

## 2019-01-16 MED ORDER — PREDNISONE 50 MG PO TABS
60.0000 mg | ORAL_TABLET | Freq: Once | ORAL | Status: AC
  Administered 2019-01-16: 60 mg via ORAL
  Filled 2019-01-16: qty 1

## 2019-01-16 MED ORDER — HYDROCODONE-ACETAMINOPHEN 5-325 MG PO TABS
2.0000 | ORAL_TABLET | Freq: Once | ORAL | Status: AC
  Administered 2019-01-16: 07:00:00 2 via ORAL
  Filled 2019-01-16: qty 2

## 2019-01-16 MED ORDER — PREDNISONE 20 MG PO TABS: 20.0000 mg | ORAL_TABLET | Freq: Two times a day (BID) | ORAL | 0 refills | Status: DC

## 2019-01-16 MED ORDER — HYDROCODONE-ACETAMINOPHEN 5-325 MG PO TABS: 1.0000 | ORAL_TABLET | ORAL | 0 refills | Status: DC | PRN

## 2019-01-16 NOTE — ED Provider Notes (Signed)
[facility] EMERGENCY DEPARTMENT Provider Note   CSN: [id number] Arrival date & time: 01/16/19  8588     History   Chief Complaint Chief Complaint  Patient presents with  . Ankle Pain    HPI [name] is a 43 y.o. female.     HPI She complains of atraumatic onset of right ankle pain, yesterday, causing pain with standing and ambulation.  No prior similar problem.  She denies fever, chills, cough, shortness of breath or chest pain.  No other joint pain.  No other complaints.  She works as a Curator.  There are no other known modifying factors.   Past Medical History:  Diagnosis Date  . Anemia   . Chronic back pain   . Hypertension   . PE (pulmonary embolism) 09/2004   while pregnant  . Sciatica     Patient Active Problem List   Diagnosis Date Noted  . Abscess of tonsil 05/23/2013    Past Surgical History:  Procedure Laterality Date  . CHOLECYSTECTOMY    . TUBAL LIGATION       OB History   No obstetric history on file.      Home Medications    Prior to Admission medications   Medication Sig Start Date End Date Taking? Authorizing Provider  HYDROcodone-acetaminophen (NORCO) 5-325 MG tablet Take 1 tablet by mouth every 4 (four) hours as needed for moderate pain. 01/16/19   Mancel Bale, MD  predniSONE (DELTASONE) 20 MG tablet Take 1 tablet (20 mg total) by mouth 2 (two) times daily. 01/16/19   Mancel Bale, MD    Family History No family history on file.  Social History Social History   Tobacco Use  . Smoking status: Current Every Day Smoker    Packs/day: 1.00    Types: Cigarettes  . Smokeless tobacco: Never Used  Substance Use Topics  . Alcohol use: Yes  . Drug use: Yes    Types: Marijuana     Allergies   Penicillins   Review of Systems Review of Systems  All other systems reviewed and are negative.    Physical Exam Updated Vital Signs BP 134/87 (BP Location: Right Arm)   Pulse 82   Temp 98.2 F (36.8 C) (Oral)    Resp 16   Ht 5\' 5"  (1.651 m)   Wt 79.8 kg   LMP 01/06/2019 (Approximate)   SpO2 98%   BMI 29.29 kg/m   Physical Exam Vitals signs and nursing note reviewed.  Constitutional:      General: She is not in acute distress.    Appearance: She is well-developed. She is not ill-appearing.  HENT:     Head: Normocephalic and atraumatic.     Right Ear: External ear normal.     Left Ear: External ear normal.  Eyes:     Conjunctiva/sclera: Conjunctivae normal.     Pupils: Pupils are equal, round, and reactive to light.  Neck:     Musculoskeletal: Normal range of motion and neck supple.     Trachea: Phonation normal.  Cardiovascular:     Rate and Rhythm: Normal rate.  Pulmonary:     Effort: Pulmonary effort is normal.  Musculoskeletal:     Comments: Right ankle tender and swollen, bilaterally.  She guards against palpation and movement of the right ankle secondary to pain.  No right knee or right foot tenderness.  Skin:    General: Skin is warm and dry.  Neurological:     Mental Status: She is  alert and oriented to person, place, and time.     Cranial Nerves: No cranial nerve deficit.     Sensory: No sensory deficit.     Motor: No abnormal muscle tone.     Coordination: Coordination normal.  Psychiatric:        Mood and Affect: Mood normal.        Behavior: Behavior normal.        Thought Content: Thought content normal.        Judgment: Judgment normal.      ED Treatments / Results  Labs (all labs ordered are listed, but only abnormal results are displayed) Labs Reviewed - No data to display  EKG None  Radiology Dg Ankle Complete Right  Result Date: 01/16/2019 CLINICAL DATA:  Swelling and pain EXAM: RIGHT ANKLE - COMPLETE 3+ VIEW COMPARISON:  None. FINDINGS: Soft tissue swelling of the right ankle most pronounced over the lateral malleolus. Small effusion is present. No acute bony abnormality. Specifically, no fracture, subluxation, or dislocation. IMPRESSION: No acute  bony abnormality. Soft tissue swelling most pronounced laterally with small ankle effusion. Electronically Signed   By: Lovena Le M.D.   On: 01/16/2019 06:08    Procedures Procedures (including critical care time)  Medications Ordered in ED Medications  HYDROcodone-acetaminophen (NORCO/VICODIN) 5-325 MG per tablet 2 tablet (2 tablets Oral Given 01/16/19 0721)  predniSONE (DELTASONE) tablet 60 mg (60 mg Oral Given 01/16/19 0721)     Initial Impression / Assessment and Plan / ED Course  I have reviewed the triage vital signs and the nursing notes.  Pertinent labs & imaging results that were available during my care of the patient were reviewed by me and considered in my medical decision making (see chart for details).  Clinical Course as of Jan 16 735  Thu Jan 16, 2019  8466 No fracture or dislocation, interpreted by me   [EW]    Clinical Course User Index [EW] Daleen Bo, MD        Patient Vitals for the past 24 hrs:  BP Temp Temp src Pulse Resp SpO2 Height Weight  01/16/19 0539 134/87 98.2 F (36.8 C) Oral 82 16 98 % - -  01/16/19 0536 - - - - - - 5\' 5"  (1.651 m) 79.8 kg    7:36 AM Reevaluation with update and discussion. After initial assessment and treatment, an updated evaluation reveals no change in clinical status, findings discussed and questions answered. Daleen Bo   Medical Decision Making: Nonspecific right ankle swelling and pain, atraumatic.  No other arthralgia.  No systemic symptoms.  Suspect occult trauma, with isolated injury to right ankle.  Vital signs are normal.  No indication for further evaluation.  No indication for aspiration of small ankle joint effusion, at this time.  CRITICAL CARE-no  Nursing Notes Reviewed/ Care Coordinated Applicable Imaging Reviewed Interpretation of Laboratory Data incorporated into ED treatment  The patient appears reasonably screened and/or stabilized for discharge and I doubt any other medical condition or  other [facility] requiring further screening, evaluation, or treatment in the ED at this time prior to discharge.  Plan: Home Medications-advised on use OTC analgesia, in lieu of narcotic treatment using Tylenol only; Home Treatments-ASO crutches elevation; return here if the recommended treatment, does not improve the symptoms; Recommended follow up-PCP, as needed  Performed by: Daleen Bo    Final Clinical Impressions(s) / ED Diagnoses   Final diagnoses:  Sprain of right ankle, unspecified ligament, initial encounter    ED Discharge  Orders         Ordered    HYDROcodone-acetaminophen (NORCO) 5-325 MG tablet  Every 4 hours PRN     01/16/19 0735    predniSONE (DELTASONE) 20 MG tablet  2 times daily     01/16/19 0735           Mancel BaleWentz, Chantry Headen, MD 01/16/19 [phone]

## 2019-01-16 NOTE — Discharge Instructions (Signed)
Wear the brace and use crutches as needed to help you get around.  Use ice on the sore area 3 times a day for 45 minutes.  Use the prescription medicine as directed.  Additionally, for pain, you can use Tylenol 650 mg every 4 hours.  See your doctor if not better in 4 or 5 days.

## 2019-01-16 NOTE — ED Triage Notes (Signed)
Pt reports pain and swelling to her right foot and ankle since yesterday.  Pt denies injury or trauma

## 2019-02-08 ENCOUNTER — Emergency Department (HOSPITAL_COMMUNITY)
Admission: EM | Admit: 2019-02-08 | Discharge: 2019-02-08 | Disposition: A | Discharge status: Home / Self Care | Payer: Self-pay | Attending: Emergency Medicine | Admitting: Emergency Medicine

## 2019-02-08 ENCOUNTER — Encounter (HOSPITAL_COMMUNITY): Payer: Self-pay | Admitting: Emergency Medicine

## 2019-02-08 ENCOUNTER — Other Ambulatory Visit: Payer: Self-pay

## 2019-02-08 ENCOUNTER — Emergency Department (HOSPITAL_COMMUNITY): Payer: Self-pay

## 2019-02-08 DIAGNOSIS — I10 Essential (primary) hypertension: Secondary | ICD-10-CM | POA: Insufficient documentation

## 2019-02-08 DIAGNOSIS — M545 Low back pain, unspecified: Secondary | ICD-10-CM

## 2019-02-08 DIAGNOSIS — D649 Anemia, unspecified: Secondary | ICD-10-CM | POA: Insufficient documentation

## 2019-02-08 DIAGNOSIS — R079 Chest pain, unspecified: Secondary | ICD-10-CM | POA: Insufficient documentation

## 2019-02-08 DIAGNOSIS — R519 Headache, unspecified: Secondary | ICD-10-CM | POA: Insufficient documentation

## 2019-02-08 DIAGNOSIS — F17210 Nicotine dependence, cigarettes, uncomplicated: Secondary | ICD-10-CM | POA: Insufficient documentation

## 2019-02-08 DIAGNOSIS — E876 Hypokalemia: Secondary | ICD-10-CM | POA: Insufficient documentation

## 2019-02-08 LAB — BASIC METABOLIC PANEL
Anion gap: 3 — ABNORMAL LOW (ref 5–15)
BUN: 7 mg/dL (ref 6–20)
CO2: 26 mmol/L (ref 22–32)
Calcium: 8.5 mg/dL — ABNORMAL LOW (ref 8.9–10.3)
Chloride: 108 mmol/L (ref 98–111)
Creatinine, Ser: 0.71 mg/dL (ref 0.44–1.00)
GFR calc Af Amer: >60 mL/min (ref >60)
GFR calc non Af Amer: >60 mL/min (ref >60)
Glucose, Bld: 108 mg/dL — ABNORMAL HIGH (ref 70–99)
Potassium: 3.3 mmol/L — ABNORMAL LOW (ref 3.5–5.1)
Sodium: 137 mmol/L (ref 135–145)

## 2019-02-08 LAB — CBC
HCT: 30.4 % — ABNORMAL LOW (ref 36.0–46.0)
Hemoglobin: 8.8 g/dL — ABNORMAL LOW (ref 12.0–15.0)
MCH: 22.7 pg — ABNORMAL LOW (ref 26.0–34.0)
MCHC: 28.9 g/dL — ABNORMAL LOW (ref 30.0–36.0)
MCV: 78.4 fL — ABNORMAL LOW (ref 80.0–100.0)
Platelets: 329 10*3/uL (ref 150–400)
RBC: 3.88 MIL/uL (ref 3.87–5.11)
RDW: 18.8 % — ABNORMAL HIGH (ref 11.5–15.5)
WBC: 4.5 10*3/uL (ref 4.0–10.5)
nRBC: 0 % (ref 0.0–0.2)

## 2019-02-08 LAB — D-DIMER, QUANTITATIVE: D-Dimer, Quant: 1.02 ug/mL-FEU — ABNORMAL HIGH (ref 0.00–0.50)

## 2019-02-08 LAB — TROPONIN I (HIGH SENSITIVITY)
Troponin I (High Sensitivity): <2 ng/L (ref <18)
Troponin I (High Sensitivity): <2 ng/L (ref <18)

## 2019-02-08 MED ORDER — DIPHENHYDRAMINE HCL 50 MG/ML IJ SOLN
12.5000 mg | Freq: Once | INTRAMUSCULAR | Status: AC
  Administered 2019-02-08: 12.5 mg via INTRAVENOUS
  Filled 2019-02-08: qty 1

## 2019-02-08 MED ORDER — METOCLOPRAMIDE HCL 5 MG/ML IJ SOLN
10.0000 mg | Freq: Once | INTRAMUSCULAR | Status: AC
  Administered 2019-02-08: 10 mg via INTRAVENOUS
  Filled 2019-02-08: qty 2

## 2019-02-08 MED ORDER — POTASSIUM CHLORIDE CRYS ER 20 MEQ PO TBCR
40.0000 meq | EXTENDED_RELEASE_TABLET | Freq: Once | ORAL | Status: AC
  Administered 2019-02-08: 40 meq via ORAL
  Filled 2019-02-08: qty 2

## 2019-02-08 MED ORDER — KETOROLAC TROMETHAMINE 30 MG/ML IJ SOLN
30.0000 mg | Freq: Once | INTRAMUSCULAR | Status: AC
  Administered 2019-02-08: 30 mg via INTRAVENOUS
  Filled 2019-02-08: qty 1

## 2019-02-08 MED ORDER — SODIUM CHLORIDE 0.9 % IV BOLUS
500.0000 mL | Freq: Once | INTRAVENOUS | Status: AC
  Administered 2019-02-08: 500 mL via INTRAVENOUS

## 2019-02-08 NOTE — ED Provider Notes (Signed)
[facility] EMERGENCY DEPARTMENT Provider Note   CSN: [id number] Arrival date & time: 02/08/19  1517     History Chief Complaint  Patient presents with  . Chest Pain    [name] is a 43 y.o. female.  Chief complaint chest pain with associated dyspnea since last night.  Review of systems positive for headache with light sensitivity and low back pain without radicular symptoms.  No gross neurological deficits, facial asymmetry, motor or sensory deficits.  She took Aleve with minimal relief.  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  Past medical history includes anemia, hypertension, back pain, pulmonary embolism, sciatica.  No previous cardiac disease.  No calf tenderness        Past Medical History:  Diagnosis Date  . Anemia   . Chronic back pain   . Hypertension   . PE (pulmonary embolism) 09/2004   while pregnant  . Sciatica     Patient Active Problem List   Diagnosis Date Noted  . Abscess of tonsil 05/23/2013    Past Surgical History:  Procedure Laterality Date  . CHOLECYSTECTOMY    . TUBAL LIGATION       OB History    Gravida  9   Para  7   Term  7   Preterm      AB  2   Living  7     SAB      TAB      Ectopic      Multiple      Live Births              History reviewed. No pertinent family history.  Social History   Tobacco Use  . Smoking status: Current Every Day Smoker    Packs/day: 1.00    Types: Cigarettes  . Smokeless tobacco: Never Used  Substance Use Topics  . Alcohol use: Yes  . Drug use: Not Currently    Types: Marijuana    Comment: per patient not this year 2020    Home Medications Prior to Admission medications   Medication Sig Start Date End Date Taking? Authorizing Provider  HYDROcodone-acetaminophen (NORCO) 5-325 MG tablet Take 1 tablet by mouth every 4 (four) hours as needed for moderate pain. 01/16/19   Mancel Bale, MD  predniSONE (DELTASONE) 20 MG tablet Take 1 tablet (20  mg total) by mouth 2 (two) times daily. 01/16/19   Mancel Bale, MD    Allergies    Penicillins  Review of Systems   Review of Systems  All other systems reviewed and are negative.   Physical Exam Updated Vital Signs BP 114/84   Pulse 64   Temp 98.7 F (37.1 C) (Oral)   Resp 18   Ht 5\' 6"  (1.676 m)   Wt 77.1 kg   LMP 02/05/2019   SpO2 100%   BMI 27.44 kg/m   Physical Exam Vitals and nursing note reviewed.  Constitutional:      Comments: Light sensitivity.  HENT:     Head: Normocephalic and atraumatic.  Eyes:     Conjunctiva/sclera: Conjunctivae normal.  Cardiovascular:     Rate and Rhythm: Normal rate and regular rhythm.  Pulmonary:     Effort: Pulmonary effort is normal.     Breath sounds: Normal breath sounds.  Abdominal:     General: Bowel sounds are normal.     Palpations: Abdomen is soft.  Musculoskeletal:        General: Normal range of  motion.     Cervical back: Neck supple.     Comments: Minimal lumbar paraspinous tenderness.  Calves nontender.  Skin:    General: Skin is warm and dry.  Neurological:     General: No focal deficit present.     Mental Status: She is alert and oriented to person, place, and time.  Psychiatric:        Behavior: Behavior normal.     ED Results / Procedures / Treatments   Labs (all labs ordered are listed, but only abnormal results are displayed) Labs Reviewed  BASIC METABOLIC PANEL - Abnormal; Notable for the following components:      Result Value   Potassium 3.3 (*)    Glucose, Bld 108 (*)    Calcium 8.5 (*)    Anion gap 3 (*)    All other components within normal limits  CBC - Abnormal; Notable for the following components:   Hemoglobin 8.8 (*)    HCT 30.4 (*)    MCV 78.4 (*)    MCH 22.7 (*)    MCHC 28.9 (*)    RDW 18.8 (*)    All other components within normal limits  D-DIMER, QUANTITATIVE (NOT AT [facility]) - Abnormal; Notable for the following components:   D-Dimer, Quant 1.02 (*)    All other  components within normal limits  POC URINE PREG, ED  TROPONIN I (HIGH SENSITIVITY)  TROPONIN I (HIGH SENSITIVITY)    EKG EKG Interpretation  Date/Time:  Saturday February 08 2019 15:34:12 EST Ventricular Rate:  76 PR Interval:    QRS Duration: 85 QT Interval:  443 QTC Calculation: 499 R Axis:   13 Text Interpretation: Sinus rhythm Borderline prolonged QT interval Confirmed by Donnetta Hutchingook, Shaana Acocella ([id number]) on 02/08/2019 5:07:19 PM   Radiology DG Chest 2 View  Result Date: 02/08/2019 CLINICAL DATA:  Multiple complaints. Chest pressure with shortness of breath and generalized fatigue. Recent DVT. EXAM: CHEST - 2 VIEW COMPARISON:  Radiographs 05/17/2018. CT 09/07/2018. FINDINGS: The heart size and mediastinal contours are stable. Known hiatal hernia is not well seen. The lungs are clear. There is no pleural effusion or pneumothorax. No acute osseous findings. Telemetry leads overlie the chest. Cholecystectomy clips are noted. IMPRESSION: Stable chest.  No active cardiopulmonary process. Electronically Signed   By: Carey BullocksWilliam  Veazey M.D.   On: 02/08/2019 16:06    Procedures Procedures (including critical care time)  Medications Ordered in ED Medications  sodium chloride 0.9 % bolus 500 mL (0 mLs Intravenous Stopped 02/08/19 1822)  ketorolac (TORADOL) 30 MG/ML injection 30 mg (30 mg Intravenous Given 02/08/19 1726)  metoCLOPramide (REGLAN) injection 10 mg (10 mg Intravenous Given 02/08/19 1723)  diphenhydrAMINE (BENADRYL) injection 12.5 mg (12.5 mg Intravenous Given 02/08/19 1727)  potassium chloride SA (KLOR-CON) CR tablet 40 mEq (40 mEq Oral Given 02/08/19 2004)    ED Course  I have reviewed the triage vital signs and the nursing notes.  Pertinent labs & imaging results that were available during my care of the patient were reviewed by me and considered in my medical decision making (see chart for details).    MDM Rules/Calculators/A&P                      Patient appears  hemodynamically stable.  Will obtain basic chest pain work-up.  D-dimer minimally elevated.  However vital signs are not indicative of a pulmonary embolus.  2015: Recheck prior to discharge.  D-dimer is stable.  Patient has had negative CT  angiograms of her chest on 09/07/2018, 05/17/2018, and 04/10/2017.  Delta troponin negative.  Patient feeling much better. Final Clinical Impression(s) / ED Diagnoses Final diagnoses:  Chest pain, unspecified type  Intractable headache, unspecified chronicity pattern, unspecified headache type  Low back pain without sciatica, unspecified back pain laterality, unspecified chronicity  Anemia, unspecified type  Hypokalemia    Rx / DC Orders ED Discharge Orders    None       Nat Christen, MD 02/08/19 2125

## 2019-02-08 NOTE — Discharge Instructions (Addendum)
You are anemic. This is a longstanding problem.  No evidence of a heart attack.  This alone can make you feel bad.  Try to follow-up with primary care.  Phone number given.

## 2019-02-08 NOTE — ED Triage Notes (Addendum)
Patient has multiple complaints. Patient c/o chest pressure that started last night with shortness of breath and generalized fatigue. Patient does report nonproductive cough. Denies any fevers. Patient also c/o headache with sensitivity to light and sound. Patient reports taking aleve, last dose today at 2pm, with no relief. Patient has hx of DVT and was on coumadin. Per patient "ran out a couple months ago and was unable to get filled."

## 2019-09-18 ENCOUNTER — Encounter (HOSPITAL_COMMUNITY): Payer: Self-pay | Admitting: *Deleted

## 2019-09-18 ENCOUNTER — Emergency Department (HOSPITAL_COMMUNITY)
Admission: EM | Admit: 2019-09-18 | Discharge: 2019-09-18 | Disposition: A | Discharge status: Home / Self Care | Payer: Self-pay | Attending: Emergency Medicine | Admitting: Emergency Medicine

## 2019-09-18 ENCOUNTER — Emergency Department (HOSPITAL_COMMUNITY): Payer: Self-pay

## 2019-09-18 ENCOUNTER — Other Ambulatory Visit: Payer: Self-pay

## 2019-09-18 DIAGNOSIS — M79605 Pain in left leg: Secondary | ICD-10-CM | POA: Insufficient documentation

## 2019-09-18 DIAGNOSIS — M79604 Pain in right leg: Secondary | ICD-10-CM | POA: Insufficient documentation

## 2019-09-18 DIAGNOSIS — Z86711 Personal history of pulmonary embolism: Secondary | ICD-10-CM | POA: Insufficient documentation

## 2019-09-18 DIAGNOSIS — M79672 Pain in left foot: Secondary | ICD-10-CM

## 2019-09-18 DIAGNOSIS — Z79899 Other long term (current) drug therapy: Secondary | ICD-10-CM | POA: Insufficient documentation

## 2019-09-18 DIAGNOSIS — D649 Anemia, unspecified: Secondary | ICD-10-CM | POA: Insufficient documentation

## 2019-09-18 DIAGNOSIS — F17210 Nicotine dependence, cigarettes, uncomplicated: Secondary | ICD-10-CM | POA: Insufficient documentation

## 2019-09-18 DIAGNOSIS — R2243 Localized swelling, mass and lump, lower limb, bilateral: Secondary | ICD-10-CM | POA: Insufficient documentation

## 2019-09-18 DIAGNOSIS — I10 Essential (primary) hypertension: Secondary | ICD-10-CM | POA: Insufficient documentation

## 2019-09-18 DIAGNOSIS — M7989 Other specified soft tissue disorders: Secondary | ICD-10-CM

## 2019-09-18 LAB — CBC WITH DIFFERENTIAL/PLATELET
Abs Immature Granulocytes: 0.01 10*3/uL (ref 0.00–0.07)
Basophils Absolute: 0 10*3/uL (ref 0.0–0.1)
Basophils Relative: 0 %
Eosinophils Absolute: 0.2 10*3/uL (ref 0.0–0.5)
Eosinophils Relative: 3 %
HCT: 27.7 % — ABNORMAL LOW (ref 36.0–46.0)
Hemoglobin: 8 g/dL — ABNORMAL LOW (ref 12.0–15.0)
Immature Granulocytes: 0 %
Lymphocytes Relative: 31 %
Lymphs Abs: 2.1 10*3/uL (ref 0.7–4.0)
MCH: 23.5 pg — ABNORMAL LOW (ref 26.0–34.0)
MCHC: 28.9 g/dL — ABNORMAL LOW (ref 30.0–36.0)
MCV: 81.2 fL (ref 80.0–100.0)
Monocytes Absolute: 0.6 10*3/uL (ref 0.1–1.0)
Monocytes Relative: 9 %
Neutro Abs: 3.8 10*3/uL (ref 1.7–7.7)
Neutrophils Relative %: 57 %
Platelets: 273 10*3/uL (ref 150–400)
RBC: 3.41 MIL/uL — ABNORMAL LOW (ref 3.87–5.11)
RDW: 18.6 % — ABNORMAL HIGH (ref 11.5–15.5)
WBC: 6.7 10*3/uL (ref 4.0–10.5)
nRBC: 0 % (ref 0.0–0.2)

## 2019-09-18 LAB — BASIC METABOLIC PANEL
Anion gap: 5 (ref 5–15)
BUN: 7 mg/dL (ref 6–20)
CO2: 26 mmol/L (ref 22–32)
Calcium: 8.3 mg/dL — ABNORMAL LOW (ref 8.9–10.3)
Chloride: 105 mmol/L (ref 98–111)
Creatinine, Ser: 0.68 mg/dL (ref 0.44–1.00)
GFR calc Af Amer: >60 mL/min (ref >60)
GFR calc non Af Amer: >60 mL/min (ref >60)
Glucose, Bld: 92 mg/dL (ref 70–99)
Potassium: 3.6 mmol/L (ref 3.5–5.1)
Sodium: 136 mmol/L (ref 135–145)

## 2019-09-18 LAB — HCG, SERUM, QUALITATIVE: Preg, Serum: NEGATIVE

## 2019-09-18 LAB — CK: Total CK: 153 U/L (ref 38–234)

## 2019-09-18 MED ORDER — LIDOCAINE 4 % EX PTCH: 1.0000 | MEDICATED_PATCH | Freq: Two times a day (BID) | CUTANEOUS | 0 refills | Status: DC

## 2019-09-18 MED ORDER — ACETAMINOPHEN 325 MG PO TABS
650.0000 mg | ORAL_TABLET | Freq: Once | ORAL | Status: AC
  Administered 2019-09-18: 650 mg via ORAL
  Filled 2019-09-18: qty 2

## 2019-09-18 MED ORDER — HYDROCODONE-ACETAMINOPHEN 5-325 MG PO TABS
1.0000 | ORAL_TABLET | Freq: Once | ORAL | Status: AC
  Administered 2019-09-18: 1 via ORAL
  Filled 2019-09-18: qty 1

## 2019-09-18 MED ORDER — IBUPROFEN 600 MG PO TABS: 600.0000 mg | ORAL_TABLET | Freq: Three times a day (TID) | ORAL | 0 refills | Status: DC

## 2019-09-18 NOTE — ED Notes (Signed)
Pt c/o bilateral leg since last night.

## 2019-09-18 NOTE — Discharge Instructions (Addendum)
You are seen in the ER for leg pain and swelling.  Our work-up is not showing any signs of heart failure, infection or blood clot in the legs.  We are unsure why you are having the discomfort you are.  We recommend that he follow-up with your primary care doctor for further evaluation.  Additionally, we did note that you have severe anemia.  This is something that the primary care doctor should also follow and manage.  If you do not have a PCP consider following up with the oncology center for further assessment.

## 2019-09-18 NOTE — ED Provider Notes (Signed)
[facility] EMERGENCY DEPARTMENT Provider Note   CSN: [id number] Arrival date & time: 09/18/19  1326     History Chief Complaint  Patient presents with  . Leg Swelling    [name] is a 44 y.o. female.  HPI     44 year old female comes in a chief complaint of leg pain and swelling.  She has history of PE, not on anticoagulation anymore.  She also has history of sciatica and chronic back pain.  Patient reports that over the past day she has noted bilateral leg swelling and pain.  She has history of blood clots and concerned about it.  The pain is primarily in her feet.  Anytime she puts pressure on it she has pain.  There is no trauma.  She denies any associated chest pain, shortness of breath, fevers, chills.  She used to smoke heavily, but now has cut back.  No substance abuse.  Past Medical History:  Diagnosis Date  . Anemia   . Chronic back pain   . Hypertension   . PE (pulmonary embolism) 09/2004   while pregnant  . Sciatica     Patient Active Problem List   Diagnosis Date Noted  . Abscess of tonsil 05/23/2013    Past Surgical History:  Procedure Laterality Date  . CHOLECYSTECTOMY    . TUBAL LIGATION       OB History    Gravida  9   Para  7   Term  7   Preterm      AB  2   Living  7     SAB      TAB      Ectopic      Multiple      Live Births              No family history on file.  Social History   Tobacco Use  . Smoking status: Current Every Day Smoker    Packs/day: 1.00    Types: Cigarettes  . Smokeless tobacco: Never Used  Vaping Use  . Vaping Use: Never used  Substance Use Topics  . Alcohol use: Yes  . Drug use: Not Currently    Types: Marijuana    Comment: per patient not this year 2020    Home Medications Prior to Admission medications   Medication Sig Start Date End Date Taking? Authorizing Provider  HYDROcodone-acetaminophen (NORCO) 5-325 MG tablet Take 1 tablet by mouth every 4 (four) hours as needed for  moderate pain. 01/16/19   Mancel Bale, MD  ibuprofen (ADVIL) 600 MG tablet Take 1 tablet (600 mg total) by mouth 3 (three) times daily. 09/18/19   Derwood Kaplan, MD  Lidocaine 4 % PTCH Apply 1 patch topically 2 (two) times daily. 09/18/19   Derwood Kaplan, MD  predniSONE (DELTASONE) 20 MG tablet Take 1 tablet (20 mg total) by mouth 2 (two) times daily. 01/16/19   Mancel Bale, MD    Allergies    Penicillins  Review of Systems   Review of Systems  Constitutional: Positive for activity change. Negative for fever.  Respiratory: Negative for shortness of breath.   Cardiovascular: Negative for chest pain.  Musculoskeletal: Positive for arthralgias and myalgias.  Hematological: Does not bruise/bleed easily.    Physical Exam Updated Vital Signs BP (!) 151/91 (BP Location: Right Arm)   Pulse 78   Temp 98.2 F (36.8 C)   Resp 12   Ht 5\' 3"  (1.6 m)   Wt 75.8 kg  LMP 08/16/2019 (Approximate)   SpO2 100%   BMI 29.58 kg/m   Physical Exam Vitals and nursing note reviewed.  Constitutional:      Appearance: She is well-developed.  HENT:     Head: Normocephalic and atraumatic.  Cardiovascular:     Rate and Rhythm: Normal rate.  Pulmonary:     Effort: Pulmonary effort is normal.  Abdominal:     General: Bowel sounds are normal.  Musculoskeletal:     Cervical back: Normal range of motion and neck supple.     Comments: Trace edema in bilateral lower extremities.  There is no unilateral swelling noted.  Patient has no calf tenderness.  She has tenderness with palpation of the plantar surface of the feet and the dorsal aspect of the feet.  Skin is warm to touch but there is no erythema.  Range of motion over the ankle is fine.  Patient has 1+ dorsalis pedis  Skin:    General: Skin is warm and dry.  Neurological:     Mental Status: She is alert and oriented to person, place, and time.     ED Results / Procedures / Treatments   Labs (all labs ordered are listed, but only  abnormal results are displayed) Labs Reviewed  BASIC METABOLIC PANEL - Abnormal; Notable for the following components:      Result Value   Calcium 8.3 (*)    All other components within normal limits  CBC WITH DIFFERENTIAL/PLATELET - Abnormal; Notable for the following components:   RBC 3.41 (*)    Hemoglobin 8.0 (*)    HCT 27.7 (*)    MCH 23.5 (*)    MCHC 28.9 (*)    RDW 18.6 (*)    All other components within normal limits  CK  HCG, SERUM, QUALITATIVE    EKG None  Radiology US Venous Img Lower Bilateral  Result Date: 09/18/2019 CLINICAL DATA:  44 year old with bilateral calf swelling. EXAM: BILATERAL LOWER EXTREMITY VENOUS DOPPLER ULTRASOUND TECHNIQUE: Gray-scale sonography with graded compression, as well as color Doppler and duplex ultrasound were performed to evaluate the lower extremity deep venous systems from the level of the common femoral vein and including the common femoral, femoral, profunda femoral, popliteal and calf veins including the posterior tibial, peroneal and gastrocnemius veins when visible. The superficial great saphenous vein was also interrogated. Spectral Doppler was utilized to evaluate flow at rest and with distal augmentation maneuvers in the common femoral, femoral and popliteal veins. COMPARISON:  None. FINDINGS: RIGHT LOWER EXTREMITY Common Femoral Vein: No evidence of thrombus. Normal compressibility, respiratory phasicity and response to augmentation. Saphenofemoral Junction: No evidence of thrombus. Normal compressibility and flow on color Doppler imaging. Profunda Femoral Vein: No evidence of thrombus. Normal compressibility and flow on color Doppler imaging. Femoral Vein: No evidence of thrombus. Normal compressibility, respiratory phasicity and response to augmentation. Popliteal Vein: No evidence of thrombus. Normal compressibility, respiratory phasicity and response to augmentation. Calf Veins: Visualized right deep calf veins are patent without  thrombus. Other Findings: Subcutaneous edema in the right ankle and right calf. LEFT LOWER EXTREMITY Common Femoral Vein: No evidence of thrombus. Normal compressibility, respiratory phasicity and response to augmentation. Saphenofemoral Junction: No evidence of thrombus. Normal compressibility and flow on color Doppler imaging. Profunda Femoral Vein: No evidence of thrombus. Normal compressibility and flow on color Doppler imaging. Femoral Vein: No evidence of thrombus. Normal compressibility, respiratory phasicity and response to augmentation. Popliteal Vein: No evidence of thrombus. Normal compressibility, respiratory phasicity and response to  augmentation. Calf Veins: Visualized left deep calf veins are patent without thrombus. Other Findings:  Subcutaneous edema in the left calf and left ankle. IMPRESSION: No evidence of deep venous thrombosis in either lower extremity. Electronically Signed   By: Richarda Overlie M.D.   On: 09/18/2019 17:41    Procedures Procedures (including critical care time)  Medications Ordered in ED Medications  acetaminophen (TYLENOL) tablet 650 mg (650 mg Oral Given 09/18/19 1627)  HYDROcodone-acetaminophen (NORCO/VICODIN) 5-325 MG per tablet 1 tablet (1 tablet Oral Given 09/18/19 1804)    ED Course  I have reviewed the triage vital signs and the nursing notes.  Pertinent labs & imaging results that were available during my care of the patient were reviewed by me and considered in my medical decision making (see chart for details).    MDM Rules/Calculators/A&P                          44 year old female comes in a chief complaint of bilateral leg pain and swelling.  On exam she does not have unilateral swelling, no signs of infection and there is no calf tenderness.  She is having tenderness over her feet bilaterally.  She does not have any signs of ischemia on exam.  We will get ultrasound DVT given her history of PE.  Clinically does not appear that she has heart failure  either.  If the ultrasound work-up is negative then patient will be discharged.  She reports that she is supposed to be getting a PCP soon.  PCP follow-up will be needed if the pain continues.  6:11 PM Patient's hemoglobin is 8.  She denies bloody stool but does indicate heavy bleeds.  We will advise her to follow-up with gynecology, Dr. Despina Hidden. Additionally, I have also provided follow-up information for heme.  Final Clinical Impression(s) / ED Diagnoses Final diagnoses:  Leg swelling  Pain in both feet  Severe anemia    Rx / DC Orders ED Discharge Orders         Ordered    ibuprofen (ADVIL) 600 MG tablet  3 times daily     Discontinue  Reprint     09/18/19 1806    Lidocaine 4 % PTCH  2 times daily     Discontinue  Reprint     09/18/19 1806           Derwood Kaplan, MD 09/18/19 1834

## 2019-09-18 NOTE — ED Triage Notes (Signed)
Bilateral leg swelling onset last night, history of blood clots in lungs

## 2019-09-24 ENCOUNTER — Encounter (HOSPITAL_COMMUNITY): Payer: Self-pay

## 2019-09-24 ENCOUNTER — Emergency Department (HOSPITAL_COMMUNITY)
Admission: EM | Admit: 2019-09-24 | Discharge: 2019-09-25 | Disposition: A | Discharge status: Home / Self Care | Payer: Medicaid Other | Attending: Emergency Medicine | Admitting: Emergency Medicine

## 2019-09-24 ENCOUNTER — Other Ambulatory Visit: Payer: Self-pay

## 2019-09-24 DIAGNOSIS — R519 Headache, unspecified: Secondary | ICD-10-CM | POA: Diagnosis not present

## 2019-09-24 DIAGNOSIS — R5383 Other fatigue: Secondary | ICD-10-CM | POA: Insufficient documentation

## 2019-09-24 DIAGNOSIS — R112 Nausea with vomiting, unspecified: Secondary | ICD-10-CM | POA: Insufficient documentation

## 2019-09-24 DIAGNOSIS — F17210 Nicotine dependence, cigarettes, uncomplicated: Secondary | ICD-10-CM | POA: Insufficient documentation

## 2019-09-24 DIAGNOSIS — R531 Weakness: Secondary | ICD-10-CM | POA: Diagnosis not present

## 2019-09-24 DIAGNOSIS — M79605 Pain in left leg: Secondary | ICD-10-CM | POA: Diagnosis not present

## 2019-09-24 DIAGNOSIS — I10 Essential (primary) hypertension: Secondary | ICD-10-CM | POA: Insufficient documentation

## 2019-09-24 DIAGNOSIS — Z20822 Contact with and (suspected) exposure to covid-19: Secondary | ICD-10-CM | POA: Insufficient documentation

## 2019-09-24 DIAGNOSIS — R059 Cough, unspecified: Secondary | ICD-10-CM

## 2019-09-24 DIAGNOSIS — H53149 Visual discomfort, unspecified: Secondary | ICD-10-CM | POA: Diagnosis not present

## 2019-09-24 DIAGNOSIS — M79604 Pain in right leg: Secondary | ICD-10-CM

## 2019-09-24 DIAGNOSIS — M791 Myalgia, unspecified site: Secondary | ICD-10-CM | POA: Diagnosis not present

## 2019-09-24 LAB — COMPREHENSIVE METABOLIC PANEL
ALT: 12 U/L (ref 0–44)
AST: 16 U/L (ref 15–41)
Albumin: 3.8 g/dL (ref 3.5–5.0)
Alkaline Phosphatase: 90 U/L (ref 38–126)
Anion gap: 6 (ref 5–15)
BUN: 7 mg/dL (ref 6–20)
CO2: 26 mmol/L (ref 22–32)
Calcium: 8.7 mg/dL — ABNORMAL LOW (ref 8.9–10.3)
Chloride: 105 mmol/L (ref 98–111)
Creatinine, Ser: 0.58 mg/dL (ref 0.44–1.00)
GFR calc Af Amer: >60 mL/min (ref >60)
GFR calc non Af Amer: >60 mL/min (ref >60)
Glucose, Bld: 100 mg/dL — ABNORMAL HIGH (ref 70–99)
Potassium: 3.4 mmol/L — ABNORMAL LOW (ref 3.5–5.1)
Sodium: 137 mmol/L (ref 135–145)
Total Bilirubin: 0.4 mg/dL (ref 0.3–1.2)
Total Protein: 7.6 g/dL (ref 6.5–8.1)

## 2019-09-24 LAB — CBC
HCT: 28.7 % — ABNORMAL LOW (ref 36.0–46.0)
Hemoglobin: 8.4 g/dL — ABNORMAL LOW (ref 12.0–15.0)
MCH: 23.5 pg — ABNORMAL LOW (ref 26.0–34.0)
MCHC: 29.3 g/dL — ABNORMAL LOW (ref 30.0–36.0)
MCV: 80.2 fL (ref 80.0–100.0)
Platelets: 312 10*3/uL (ref 150–400)
RBC: 3.58 MIL/uL — ABNORMAL LOW (ref 3.87–5.11)
RDW: 19 % — ABNORMAL HIGH (ref 11.5–15.5)
WBC: 8.1 10*3/uL (ref 4.0–10.5)
nRBC: 0 % (ref 0.0–0.2)

## 2019-09-24 LAB — LIPASE, BLOOD: Lipase: 20 U/L (ref 11–51)

## 2019-09-24 NOTE — ED Triage Notes (Signed)
Pt to er, pt states that she was here a few days ago for some swelling in her legs, states that they couldn't find any blood clot, states that she has hx of clots in her legs.  Pt states "I just don't feel too good"  Pt c/o headache, nausea, and leg pain.  Pt states that the headache started a couple hours ago when [place].  States that she took 4 alieve without relief.

## 2019-09-25 ENCOUNTER — Emergency Department (HOSPITAL_COMMUNITY): Payer: Medicaid Other

## 2019-09-25 LAB — CK: Total CK: 144 U/L (ref 38–234)

## 2019-09-25 LAB — SARS CORONAVIRUS 2 BY RT PCR (HOSPITAL ORDER, PERFORMED IN ~~LOC~~ HOSPITAL LAB): SARS Coronavirus 2: NEGATIVE

## 2019-09-25 MED ORDER — POTASSIUM CHLORIDE CRYS ER 20 MEQ PO TBCR
40.0000 meq | EXTENDED_RELEASE_TABLET | Freq: Once | ORAL | Status: AC
  Administered 2019-09-25: 40 meq via ORAL
  Filled 2019-09-25: qty 2

## 2019-09-25 MED ORDER — DIPHENHYDRAMINE HCL 50 MG/ML IJ SOLN
25.0000 mg | Freq: Once | INTRAMUSCULAR | Status: AC
  Administered 2019-09-25: 25 mg via INTRAVENOUS
  Filled 2019-09-25: qty 1

## 2019-09-25 MED ORDER — KETOROLAC TROMETHAMINE 30 MG/ML IJ SOLN
30.0000 mg | Freq: Once | INTRAMUSCULAR | Status: AC
  Administered 2019-09-25: 30 mg via INTRAVENOUS
  Filled 2019-09-25: qty 1

## 2019-09-25 MED ORDER — SODIUM CHLORIDE 0.9 % IV BOLUS
1000.0000 mL | Freq: Once | INTRAVENOUS | Status: AC
  Administered 2019-09-25: 1000 mL via INTRAVENOUS

## 2019-09-25 MED ORDER — IOHEXOL 350 MG/ML SOLN
100.0000 mL | Freq: Once | INTRAVENOUS | Status: AC | PRN
  Administered 2019-09-25: 100 mL via INTRAVENOUS

## 2019-09-25 MED ORDER — METOCLOPRAMIDE HCL 5 MG/ML IJ SOLN
10.0000 mg | Freq: Once | INTRAMUSCULAR | Status: AC
  Administered 2019-09-25: 10 mg via INTRAVENOUS
  Filled 2019-09-25: qty 2

## 2019-09-25 NOTE — Discharge Instructions (Signed)
There is no evidence of bleeding in your head or aneurysm.  Keep yourself hydrated.  Establish care with a primary doctor for follow-up.  Return to the ED with new or worsening symptoms.

## 2019-09-25 NOTE — ED Provider Notes (Signed)
[facility] EMERGENCY DEPARTMENT Provider Note   CSN: [id number] Arrival date & time: 09/24/19  2117     History Chief Complaint  Patient presents with  . Headache  . Leg Pain    [name] is a 44 y.o. female.  Patient with multiple complaints.  States she was seen in the ED 4 days ago due to pain and swelling in her bilateral legs.  Has had a history of a PE in the past but is no longer anticoagulated.  She describes intermittent pain from her shins down bilaterally that comes and goes lasting for several hours at a time.  She states the swelling goes up and down as well.  She had a DVT study that showed no DVT.  States she is still having intermittent pain and swelling in her legs.  No chest pain or shortness of breath.  Today she had a severe headache that onset around 6 PM while she was watching television.  She describes sharp pain behind her eyes with associated nausea and vomiting and photophobia.  Has not had this kind of headache in the past.  No focal weakness, numbness or tingling.  No difficulty speaking or difficulty swallowing.  No current blood thinner use now.  She took 4 doses of Aleve without relief.  She denies being diagnosed with migraines in the past.  She denies any runny nose, sore throat, productive cough.  She is a smoker.  States she was told to follow-up with her doctor regarding her leg pain or leg swelling.  She has not done this yet.  The history is provided by the patient.  Headache Associated symptoms: fatigue, myalgias, nausea, photophobia, vomiting and weakness   Associated symptoms: no abdominal pain, no back pain, no congestion, no cough and no numbness   Leg Pain Associated symptoms: fatigue   Associated symptoms: no back pain        Past Medical History:  Diagnosis Date  . Anemia   . Chronic back pain   . Hypertension   . PE (pulmonary embolism) 09/2004   while pregnant  . Sciatica     Patient Active Problem List   Diagnosis Date  Noted  . Abscess of tonsil 05/23/2013    Past Surgical History:  Procedure Laterality Date  . CHOLECYSTECTOMY    . TUBAL LIGATION       OB History    Gravida  9   Para  7   Term  7   Preterm      AB  2   Living  7     SAB      TAB      Ectopic      Multiple      Live Births              History reviewed. No pertinent family history.  Social History   Tobacco Use  . Smoking status: Current Every Day Smoker    Packs/day: 0.50    Types: Cigarettes  . Smokeless tobacco: Never Used  Vaping Use  . Vaping Use: Never used  Substance Use Topics  . Alcohol use: Yes  . Drug use: Not Currently    Home Medications Prior to Admission medications   Medication Sig Start Date End Date Taking? Authorizing Provider  HYDROcodone-acetaminophen (NORCO) 5-325 MG tablet Take 1 tablet by mouth every 4 (four) hours as needed for moderate pain. 01/16/19   Mancel Bale, MD  ibuprofen (ADVIL) 600 MG tablet Take 1 tablet (  600 mg total) by mouth 3 (three) times daily. 09/18/19   Derwood Kaplan, MD  Lidocaine 4 % PTCH Apply 1 patch topically 2 (two) times daily. 09/18/19   Derwood Kaplan, MD  predniSONE (DELTASONE) 20 MG tablet Take 1 tablet (20 mg total) by mouth 2 (two) times daily. 01/16/19   Mancel Bale, MD    Allergies    Penicillins  Review of Systems   Review of Systems  Constitutional: Positive for fatigue. Negative for activity change, appetite change and chills.  HENT: Negative for congestion and rhinorrhea.   Eyes: Positive for photophobia.  Respiratory: Negative for cough, chest tightness and shortness of breath.   Cardiovascular: Negative for chest pain.  Gastrointestinal: Positive for nausea and vomiting. Negative for abdominal pain.  Genitourinary: Negative for dysuria and hematuria.  Musculoskeletal: Positive for arthralgias and myalgias. Negative for back pain.  Neurological: Positive for weakness and headaches. Negative for light-headedness and  numbness.   all other systems are negative except as noted in the HPI and PMH.    Physical Exam Updated Vital Signs BP (!) 168/100 (BP Location: Right Arm)   Pulse 76   Temp 98.4 F (36.9 C) (Oral)   Resp 18   Ht 5\' 4"  (1.626 m)   Wt 75 kg   SpO2 99%   BMI 28.38 kg/m   Physical Exam Vitals and nursing note reviewed.  Constitutional:      General: She is not in acute distress.    Appearance: She is well-developed.  HENT:     Head: Normocephalic and atraumatic.     Mouth/Throat:     Pharynx: No oropharyngeal exudate.  Eyes:     Conjunctiva/sclera: Conjunctivae normal.     Pupils: Pupils are equal, round, and reactive to light.  Neck:     Comments: No meningismus. Cardiovascular:     Rate and Rhythm: Normal rate and regular rhythm.     Heart sounds: Normal heart sounds. No murmur heard.   Pulmonary:     Effort: Pulmonary effort is normal. No respiratory distress.     Breath sounds: Normal breath sounds.  Abdominal:     Palpations: Abdomen is soft.     Tenderness: There is no abdominal tenderness. There is no guarding or rebound.  Musculoskeletal:        General: Swelling and tenderness present. Normal range of motion.     Cervical back: Normal range of motion and neck supple.     Comments: Lower legs appear normal to inspection bilaterally.  Intact DP and PT pulses.  Compartments are soft. Feet Are diffusely tender to palpation.  No overlying rashes or erythema. Minimal trace pretibial edema  5/5 strength in bilateral lower extremities. Ankle plantar and dorsiflexion intact. Great toe extension intact bilaterally. +2 DP and PT pulses. +2 patellar reflexes bilaterally.   Skin:    General: Skin is warm.  Neurological:     Mental Status: She is alert and oriented to person, place, and time.     Cranial Nerves: No cranial nerve deficit.     Motor: No abnormal muscle tone.     Coordination: Coordination normal.     Comments:  5/5 strength throughout. CN 2-12  intact.Equal grip strength.   Psychiatric:        Behavior: Behavior normal.     ED Results / Procedures / Treatments   Labs (all labs ordered are listed, but only abnormal results are displayed) Labs Reviewed  COMPREHENSIVE METABOLIC PANEL - Abnormal; Notable for the following  components:      Result Value   Potassium 3.4 (*)    Glucose, Bld 100 (*)    Calcium 8.7 (*)    All other components within normal limits  CBC - Abnormal; Notable for the following components:   RBC 3.58 (*)    Hemoglobin 8.4 (*)    HCT 28.7 (*)    MCH 23.5 (*)    MCHC 29.3 (*)    RDW 19.0 (*)    All other components within normal limits  SARS CORONAVIRUS 2 BY RT PCR (HOSPITAL ORDER, PERFORMED IN [facility])  LIPASE, BLOOD  CK  URINALYSIS, ROUTINE W REFLEX MICROSCOPIC  POC URINE PREG, ED    EKG None  Radiology CT Angio Head W or Wo Contrast  Result Date: 09/25/2019 CLINICAL DATA:  Headache and nausea EXAM: CT ANGIOGRAPHY HEAD AND NECK TECHNIQUE: Multidetector CT imaging of the head and neck was performed using the standard protocol during bolus administration of intravenous contrast. Multiplanar CT image reconstructions and MIPs were obtained to evaluate the vascular anatomy. Carotid stenosis measurements (when applicable) are obtained utilizing NASCET criteria, using the distal internal carotid diameter as the denominator. CONTRAST:  OMNIPAQUE IOHEXOL 350 MG/ML SOLN COMPARISON:  None. FINDINGS: CT HEAD FINDINGS Brain: There is no mass, hemorrhage or extra-axial collection. The size and configuration of the ventricles and extra-axial CSF spaces are normal. There is no acute or chronic infarction. The brain parenchyma is normal. Skull: The visualized skull base, calvarium and extracranial soft tissues are normal. Sinuses/Orbits: No fluid levels or advanced mucosal thickening of the visualized paranasal sinuses. No mastoid or middle ear effusion. The orbits are normal. CTA NECK FINDINGS  SKELETON: There is no bony spinal canal stenosis. No lytic or blastic lesion. OTHER NECK: Normal pharynx, larynx and major salivary glands. No cervical lymphadenopathy. Unremarkable thyroid gland. UPPER CHEST: No pneumothorax or pleural effusion. No nodules or masses. AORTIC ARCH: There is no calcific atherosclerosis of the aortic arch. There is no aneurysm, dissection or hemodynamically significant stenosis of the visualized portion of the aorta. Normal variant aortic arch branching pattern with the brachiocephalic and left common carotid arteries sharing a common origin. The visualized proximal subclavian arteries are widely patent. RIGHT CAROTID SYSTEM: Normal without aneurysm, dissection or stenosis. LEFT CAROTID SYSTEM: Normal without aneurysm, dissection or stenosis. VERTEBRAL ARTERIES: Left dominant configuration. Both origins are clearly patent. There is no dissection, occlusion or flow-limiting stenosis to the skull base (V1-V3 segments). CTA HEAD FINDINGS POSTERIOR CIRCULATION: --Vertebral arteries: Normal V4 segments. --Inferior cerebellar arteries: Normal. --Basilar artery: Normal. --Superior cerebellar arteries: Normal. --Posterior cerebral arteries (PCA): Normal. ANTERIOR CIRCULATION: --Intracranial internal carotid arteries: Normal. --Anterior cerebral arteries (ACA): Normal. Both A1 segments are present. Patent anterior communicating artery (a-comm). --Middle cerebral arteries (MCA): Normal. VENOUS SINUSES: As permitted by contrast timing, patent. ANATOMIC VARIANTS: None Review of the MIP images confirms the above findings. IMPRESSION: Normal CTA of the head and neck. Electronically Signed   By: Deatra Robinson M.D.   On: 09/25/2019 01:34   CT Angio Neck W and/or Wo Contrast  Result Date: 09/25/2019 CLINICAL DATA:  Headache and nausea EXAM: CT ANGIOGRAPHY HEAD AND NECK TECHNIQUE: Multidetector CT imaging of the head and neck was performed using the standard protocol during bolus administration of  intravenous contrast. Multiplanar CT image reconstructions and MIPs were obtained to evaluate the vascular anatomy. Carotid stenosis measurements (when applicable) are obtained utilizing NASCET criteria, using the distal internal carotid diameter as the denominator. CONTRAST:  OMNIPAQUE IOHEXOL 350 MG/ML SOLN COMPARISON:  None. FINDINGS: CT HEAD FINDINGS Brain: There is no mass, hemorrhage or extra-axial collection. The size and configuration of the ventricles and extra-axial CSF spaces are normal. There is no acute or chronic infarction. The brain parenchyma is normal. Skull: The visualized skull base, calvarium and extracranial soft tissues are normal. Sinuses/Orbits: No fluid levels or advanced mucosal thickening of the visualized paranasal sinuses. No mastoid or middle ear effusion. The orbits are normal. CTA NECK FINDINGS SKELETON: There is no bony spinal canal stenosis. No lytic or blastic lesion. OTHER NECK: Normal pharynx, larynx and major salivary glands. No cervical lymphadenopathy. Unremarkable thyroid gland. UPPER CHEST: No pneumothorax or pleural effusion. No nodules or masses. AORTIC ARCH: There is no calcific atherosclerosis of the aortic arch. There is no aneurysm, dissection or hemodynamically significant stenosis of the visualized portion of the aorta. Normal variant aortic arch branching pattern with the brachiocephalic and left common carotid arteries sharing a common origin. The visualized proximal subclavian arteries are widely patent. RIGHT CAROTID SYSTEM: Normal without aneurysm, dissection or stenosis. LEFT CAROTID SYSTEM: Normal without aneurysm, dissection or stenosis. VERTEBRAL ARTERIES: Left dominant configuration. Both origins are clearly patent. There is no dissection, occlusion or flow-limiting stenosis to the skull base (V1-V3 segments). CTA HEAD FINDINGS POSTERIOR CIRCULATION: --Vertebral arteries: Normal V4 segments. --Inferior cerebellar arteries: Normal. --Basilar artery:  Normal. --Superior cerebellar arteries: Normal. --Posterior cerebral arteries (PCA): Normal. ANTERIOR CIRCULATION: --Intracranial internal carotid arteries: Normal. --Anterior cerebral arteries (ACA): Normal. Both A1 segments are present. Patent anterior communicating artery (a-comm). --Middle cerebral arteries (MCA): Normal. VENOUS SINUSES: As permitted by contrast timing, patent. ANATOMIC VARIANTS: None Review of the MIP images confirms the above findings. IMPRESSION: Normal CTA of the head and neck. Electronically Signed   By: Deatra Robinson M.D.   On: 09/25/2019 01:34   DG Chest Port 1 View  Result Date: 09/25/2019 CLINICAL DATA:  Lower extremity edema and cough EXAM: PORTABLE CHEST 1 VIEW COMPARISON:  None. FINDINGS: The heart size and mediastinal contours are within normal limits. Both lungs are clear. The visualized skeletal structures are unremarkable. IMPRESSION: No active disease. Electronically Signed   By: Deatra Robinson M.D.   On: 09/25/2019 00:46    Procedures Procedures (including critical care time)  Medications Ordered in ED Medications  metoCLOPramide (REGLAN) injection 10 mg (has no administration in time range)  diphenhydrAMINE (BENADRYL) injection 25 mg (has no administration in time range)  sodium chloride 0.9 % bolus 1,000 mL (has no administration in time range)    ED Course  I have reviewed the triage vital signs and the nursing notes.  Pertinent labs & imaging results that were available during my care of the patient were reviewed by me and considered in my medical decision making (see chart for details).    MDM Rules/Calculators/A&P                         Ongoing leg pain for 5 or 6 days.  Negative Doppler study on July 29.  Neurovascular intact with intact distal pulses.  Compartments are soft  Severe headache that she describes as sudden onset with nausea, vomiting, and photophobia.  No neurological deficits.  Patient having a difficult time describing the  onset of her headache.  CT angio was obtained to rule out aneurysm given potential sudden onset headache.  This is negative for hemorrhage and negative for aneurysm.  Patient given Reglan, Benadryl, Toradol and IV fluids.  Labs show stable anemia at 8.4.  Electrolytes and CK are normal. Potassium slightly low at 3.4.  No evidence of subarachnoid hemorrhage, meningitis, temporal arteritis.  Patient feels improved on recheck.   She is tolerating p.o.  Reassuring results discussed with the patient. Potassium supplementation will be given.  This may be contributing to her lower extremity cramps.  Advised p.o. hydration at home, PCP follow-up.  Patient reassured that there is no evidence of DVT on recent imaging. No evidence of heart failure.  Needs to establish care with PCP.  Return precautions discussed Final Clinical Impression(s) / ED Diagnoses Final diagnoses:  Bad headache  Pain in both lower extremities    Rx / DC Orders ED Discharge Orders    None       Hutch Rhett, Jeannett SeniorStephen, MD 09/25/19 0300

## 2019-10-11 ENCOUNTER — Encounter (HOSPITAL_COMMUNITY): Payer: Self-pay | Admitting: Emergency Medicine

## 2019-10-11 ENCOUNTER — Emergency Department (HOSPITAL_COMMUNITY)
Admission: EM | Admit: 2019-10-11 | Discharge: 2019-10-11 | Disposition: A | Discharge status: Home / Self Care | Payer: Medicaid Other | Attending: Emergency Medicine | Admitting: Emergency Medicine

## 2019-10-11 ENCOUNTER — Other Ambulatory Visit: Payer: Self-pay

## 2019-10-11 DIAGNOSIS — K0889 Other specified disorders of teeth and supporting structures: Secondary | ICD-10-CM | POA: Insufficient documentation

## 2019-10-11 DIAGNOSIS — I10 Essential (primary) hypertension: Secondary | ICD-10-CM | POA: Insufficient documentation

## 2019-10-11 DIAGNOSIS — F17210 Nicotine dependence, cigarettes, uncomplicated: Secondary | ICD-10-CM | POA: Diagnosis not present

## 2019-10-11 MED ORDER — IBUPROFEN 400 MG PO TABS
600.0000 mg | ORAL_TABLET | Freq: Once | ORAL | Status: AC
  Administered 2019-10-11: 600 mg via ORAL
  Filled 2019-10-11: qty 2

## 2019-10-11 MED ORDER — OXYCODONE-ACETAMINOPHEN 5-325 MG PO TABS
1.0000 | ORAL_TABLET | Freq: Once | ORAL | Status: AC
  Administered 2019-10-11: 1 via ORAL
  Filled 2019-10-11: qty 1

## 2019-10-11 MED ORDER — HYDROCODONE-ACETAMINOPHEN 5-325 MG PO TABS: 1.0000 | ORAL_TABLET | ORAL | 0 refills | Status: DC | PRN

## 2019-10-11 MED ORDER — CLINDAMYCIN HCL 150 MG PO CAPS
300.0000 mg | ORAL_CAPSULE | Freq: Three times a day (TID) | ORAL | 0 refills | Status: DC
Start: 2019-10-11 — End: 2019-12-01

## 2019-10-11 NOTE — Discharge Instructions (Signed)
Continue to take ibuprofen 600 mg every 6 hours as needed for pain. Please refer to dental resources. Do not drive while taking prescribed pain medication.

## 2019-10-11 NOTE — ED Triage Notes (Signed)
Pt c/o right lower dental pain since 1300 yesterday.

## 2019-10-15 NOTE — ED Provider Notes (Signed)
[facility] EMERGENCY DEPARTMENT Provider Note   CSN: [id number] Arrival date & time: 10/11/19  0020     History Chief Complaint  Patient presents with  . Dental Pain    [name] is a 44 y.o. female.  HPI   44 year old female with dental pain.  Onset yesterday.  Persistent since then.  She is been trying taking Tylenol and ibuprofen.  Also using Orajel.  She feels like none of these have slightly helped her symptoms.  No fevers.  No difficulty breathing or swallowing.  Has yet to obtain dental evaluation.  Past Medical History:  Diagnosis Date  . Anemia   . Chronic back pain   . Hypertension   . PE (pulmonary embolism) 09/2004   while pregnant  . Sciatica     Patient Active Problem List   Diagnosis Date Noted  . Abscess of tonsil 05/23/2013    Past Surgical History:  Procedure Laterality Date  . CHOLECYSTECTOMY    . TUBAL LIGATION       OB History    Gravida  9   Para  7   Term  7   Preterm      AB  2   Living  7     SAB      TAB      Ectopic      Multiple      Live Births              No family history on file.  Social History   Tobacco Use  . Smoking status: Current Every Day Smoker    Packs/day: 0.50    Types: Cigarettes  . Smokeless tobacco: Never Used  Vaping Use  . Vaping Use: Never used  Substance Use Topics  . Alcohol use: Yes  . Drug use: Not Currently    Home Medications Prior to Admission medications   Medication Sig Start Date End Date Taking? Authorizing Provider  clindamycin (CLEOCIN) 150 MG capsule Take 2 capsules (300 mg total) by mouth 3 (three) times daily. 10/11/19   Raeford Razor, MD  HYDROcodone-acetaminophen (NORCO/VICODIN) 5-325 MG tablet Take 1 tablet by mouth every 4 (four) hours as needed. 10/11/19   Raeford Razor, MD  ibuprofen (ADVIL) 600 MG tablet Take 1 tablet (600 mg total) by mouth 3 (three) times daily. 09/18/19   Derwood Kaplan, MD    Allergies    Penicillins  Review of Systems     Review of Systems All systems reviewed and negative, other than as noted in HPI.  Physical Exam Updated Vital Signs BP (!) 170/95   Pulse 72   Temp 98.7 F (37.1 C) (Oral)   Resp 14   Ht 5\' 4"  (1.626 m)   Wt 75 kg   SpO2 98%   BMI 28.38 kg/m   Physical Exam Vitals and nursing note reviewed.  Constitutional:      General: She is not in acute distress.    Appearance: She is well-developed.  HENT:     Head: Normocephalic and atraumatic.     Comments: Poor dentition.  No discrete drainable collection noted.  Submental tissue soft.  Normal sounding voice.  Neck supple. Eyes:     General:        Right eye: No discharge.        Left eye: No discharge.     Conjunctiva/sclera: Conjunctivae normal.  Cardiovascular:     Rate and Rhythm: Normal rate and regular rhythm.     Heart  sounds: Normal heart sounds. No murmur heard.  No friction rub. No gallop.   Pulmonary:     Effort: Pulmonary effort is normal. No respiratory distress.     Breath sounds: Normal breath sounds.  Abdominal:     General: There is no distension.     Palpations: Abdomen is soft.     Tenderness: There is no abdominal tenderness.  Musculoskeletal:        General: No tenderness.     Cervical back: Neck supple.  Skin:    General: Skin is warm and dry.  Neurological:     Mental Status: She is alert.  Psychiatric:        Behavior: Behavior normal.        Thought Content: Thought content normal.     ED Results / Procedures / Treatments   Labs (all labs ordered are listed, but only abnormal results are displayed) Labs Reviewed - No data to display  EKG None  Radiology No results found.  Procedures Procedures (including critical care time)  Medications Ordered in ED Medications  oxyCODONE-acetaminophen (PERCOCET/ROXICET) 5-325 MG per tablet 1 tablet (1 tablet Oral Given 10/11/19 0745)  ibuprofen (ADVIL) tablet 600 mg (600 mg Oral Given 10/11/19 0745)    ED Course  I have reviewed the triage  vital signs and the nursing notes.  Pertinent labs & imaging results that were available during my care of the patient were reviewed by me and considered in my medical decision making (see chart for details).    MDM Rules/Calculators/A&P                          44 year old female with dental pain.  No evidence of airway compromise.  Antibiotics.  As needed pain medication.  Needs definitive dental follow-up.  Dental resource list provided.  Final Clinical Impression(s) / ED Diagnoses Final diagnoses:  Pain, dental    Rx / DC Orders ED Discharge Orders         Ordered    HYDROcodone-acetaminophen (NORCO/VICODIN) 5-325 MG tablet  Every 4 hours PRN        10/11/19 0735    clindamycin (CLEOCIN) 150 MG capsule  3 times daily        10/11/19 0735           Raeford Razor, MD 10/15/19 1055

## 2019-12-01 ENCOUNTER — Emergency Department (HOSPITAL_COMMUNITY): Payer: Medicaid Other

## 2019-12-01 ENCOUNTER — Other Ambulatory Visit: Payer: Self-pay

## 2019-12-01 ENCOUNTER — Encounter (HOSPITAL_COMMUNITY): Payer: Self-pay | Admitting: Emergency Medicine

## 2019-12-01 ENCOUNTER — Emergency Department (HOSPITAL_COMMUNITY)
Admission: EM | Admit: 2019-12-01 | Discharge: 2019-12-01 | Disposition: A | Discharge status: Home / Self Care | Payer: Medicaid Other | Attending: Emergency Medicine | Admitting: Emergency Medicine

## 2019-12-01 DIAGNOSIS — M546 Pain in thoracic spine: Secondary | ICD-10-CM | POA: Insufficient documentation

## 2019-12-01 DIAGNOSIS — E876 Hypokalemia: Secondary | ICD-10-CM | POA: Diagnosis not present

## 2019-12-01 DIAGNOSIS — I10 Essential (primary) hypertension: Secondary | ICD-10-CM | POA: Insufficient documentation

## 2019-12-01 DIAGNOSIS — F17210 Nicotine dependence, cigarettes, uncomplicated: Secondary | ICD-10-CM | POA: Diagnosis not present

## 2019-12-01 DIAGNOSIS — M549 Dorsalgia, unspecified: Secondary | ICD-10-CM

## 2019-12-01 DIAGNOSIS — R079 Chest pain, unspecified: Secondary | ICD-10-CM | POA: Insufficient documentation

## 2019-12-01 DIAGNOSIS — D509 Iron deficiency anemia, unspecified: Secondary | ICD-10-CM

## 2019-12-01 DIAGNOSIS — M533 Sacrococcygeal disorders, not elsewhere classified: Secondary | ICD-10-CM | POA: Diagnosis not present

## 2019-12-01 LAB — CBC WITH DIFFERENTIAL/PLATELET
Abs Immature Granulocytes: 0.01 10*3/uL (ref 0.00–0.07)
Basophils Absolute: 0 10*3/uL (ref 0.0–0.1)
Basophils Relative: 0 %
Eosinophils Absolute: 0.1 10*3/uL (ref 0.0–0.5)
Eosinophils Relative: 2 %
HCT: 29.4 % — ABNORMAL LOW (ref 36.0–46.0)
Hemoglobin: 8.8 g/dL — ABNORMAL LOW (ref 12.0–15.0)
Immature Granulocytes: 0 %
Lymphocytes Relative: 32 %
Lymphs Abs: 2.3 10*3/uL (ref 0.7–4.0)
MCH: 23.4 pg — ABNORMAL LOW (ref 26.0–34.0)
MCHC: 29.9 g/dL — ABNORMAL LOW (ref 30.0–36.0)
MCV: 78.2 fL — ABNORMAL LOW (ref 80.0–100.0)
Monocytes Absolute: 0.6 10*3/uL (ref 0.1–1.0)
Monocytes Relative: 8 %
Neutro Abs: 4.2 10*3/uL (ref 1.7–7.7)
Neutrophils Relative %: 58 %
Platelets: 277 10*3/uL (ref 150–400)
RBC: 3.76 MIL/uL — ABNORMAL LOW (ref 3.87–5.11)
RDW: 18.9 % — ABNORMAL HIGH (ref 11.5–15.5)
WBC: 7.2 10*3/uL (ref 4.0–10.5)
nRBC: 0 % (ref 0.0–0.2)

## 2019-12-01 LAB — BASIC METABOLIC PANEL
Anion gap: 9 (ref 5–15)
BUN: 8 mg/dL (ref 6–20)
CO2: 27 mmol/L (ref 22–32)
Calcium: 8.9 mg/dL (ref 8.9–10.3)
Chloride: 100 mmol/L (ref 98–111)
Creatinine, Ser: 0.7 mg/dL (ref 0.44–1.00)
GFR, Estimated: >60 mL/min (ref >60)
Glucose, Bld: 105 mg/dL — ABNORMAL HIGH (ref 70–99)
Potassium: 3.3 mmol/L — ABNORMAL LOW (ref 3.5–5.1)
Sodium: 136 mmol/L (ref 135–145)

## 2019-12-01 LAB — D-DIMER, QUANTITATIVE: D-Dimer, Quant: 0.6 ug/mL-FEU — ABNORMAL HIGH (ref 0.00–0.50)

## 2019-12-01 MED ORDER — POTASSIUM CHLORIDE CRYS ER 20 MEQ PO TBCR
40.0000 meq | EXTENDED_RELEASE_TABLET | Freq: Once | ORAL | Status: AC
  Administered 2019-12-01: 40 meq via ORAL
  Filled 2019-12-01: qty 2

## 2019-12-01 MED ORDER — IOHEXOL 350 MG/ML SOLN
100.0000 mL | Freq: Once | INTRAVENOUS | Status: AC | PRN
  Administered 2019-12-01: 100 mL via INTRAVENOUS

## 2019-12-01 MED ORDER — CYCLOBENZAPRINE HCL 10 MG PO TABS
10.0000 mg | ORAL_TABLET | Freq: Two times a day (BID) | ORAL | 0 refills | Status: DC | PRN
Start: 2019-12-01 — End: 2020-11-09

## 2019-12-01 MED ORDER — KETOROLAC TROMETHAMINE 30 MG/ML IJ SOLN
30.0000 mg | Freq: Once | INTRAMUSCULAR | Status: AC
  Administered 2019-12-01: 30 mg via INTRAVENOUS
  Filled 2019-12-01: qty 1

## 2019-12-01 MED ORDER — IBUPROFEN 600 MG PO TABS
600.0000 mg | ORAL_TABLET | Freq: Three times a day (TID) | ORAL | 0 refills | Status: DC
Start: 2019-12-01 — End: 2020-11-09

## 2019-12-01 NOTE — ED Triage Notes (Signed)
Pt with c/o "knot" to R scapula area that "makes it difficult to breathe", and travels down to lower back, and makes it difficult to move R arm.

## 2019-12-01 NOTE — ED Provider Notes (Signed)
[facility] EMERGENCY DEPARTMENT Provider Note   CSN: [id number] Arrival date & time: 12/01/19  0122   History Chief Complaint  Patient presents with  . Multiple complaints    [name] is a 44 y.o. female.  The history is provided by the patient.  She has history of hypertension, pulmonary embolism while pregnant and comes in with pain at her upper back which radiates down almost to the sacrum.  This started at 1 PM.  It is worse if she lays on it and worse if she takes of breath.  It seems to take her breath away.  She is concerned because this is how she felt when she had a pulmonary embolism.  She is not short of breath, but does feel like her breath gets cut off when she tries to take a deep breath.  She denies cough or fever or chills.  There has been no vomiting or diarrhea.  She denies diaphoresis.  There has been no recent travel or surgery.  She is not on any exogenous estrogens but she does smoke cigarettes.  She rates her pain at 8/10 at rest, 10/10 if she lays on her back.  She denies any trauma.  She has not taken anything for it.  Past Medical History:  Diagnosis Date  . Anemia   . Chronic back pain   . Hypertension   . PE (pulmonary embolism) 09/2004   while pregnant  . Sciatica     Patient Active Problem List   Diagnosis Date Noted  . Abscess of tonsil 05/23/2013    Past Surgical History:  Procedure Laterality Date  . CHOLECYSTECTOMY    . TUBAL LIGATION       OB History    Gravida  9   Para  7   Term  7   Preterm      AB  2   Living  7     SAB      TAB      Ectopic      Multiple      Live Births              History reviewed. No pertinent family history.  Social History   Tobacco Use  . Smoking status: Current Every Day Smoker    Packs/day: 0.50    Types: Cigarettes  . Smokeless tobacco: Never Used  Vaping Use  . Vaping Use: Never used  Substance Use Topics  . Alcohol use: Yes  . Drug use: Not Currently    Home  Medications Prior to Admission medications   Medication Sig Start Date End Date Taking? Authorizing Provider  clindamycin (CLEOCIN) 150 MG capsule Take 2 capsules (300 mg total) by mouth 3 (three) times daily. 10/11/19   Raeford Razor, MD  HYDROcodone-acetaminophen (NORCO/VICODIN) 5-325 MG tablet Take 1 tablet by mouth every 4 (four) hours as needed. 10/11/19   Raeford Razor, MD  ibuprofen (ADVIL) 600 MG tablet Take 1 tablet (600 mg total) by mouth 3 (three) times daily. 09/18/19   Derwood Kaplan, MD    Allergies    Penicillins  Review of Systems   Review of Systems  All other systems reviewed and are negative.   Physical Exam Updated Vital Signs BP (!) 153/95 (BP Location: Left Arm)   Pulse 95   Temp 98.4 F (36.9 C) (Oral)   Resp 18   Ht 5\' 4"  (1.626 m)   Wt 75 kg   LMP 11/17/2019   SpO2 97%  BMI 28.38 kg/m   Physical Exam Vitals and nursing note reviewed.   44 year old female, resting comfortably and in no acute distress. Vital signs are significant for elevated blood pressure. Oxygen saturation is 97%, which is normal. Head is normocephalic and atraumatic. PERRLA, EOMI. Oropharynx is clear. Neck is nontender and supple without adenopathy or JVD. Back is tender in the left paraspinal area in the mid and upper thoracic region.  There is no crepitus.  There is no CVA tenderness. Lungs are clear without rales, wheezes, or rhonchi. Chest is nontender. Heart has regular rate and rhythm without murmur. Abdomen is soft, flat, nontender without masses or hepatosplenomegaly and peristalsis is normoactive. Extremities have no cyanosis or edema, full range of motion is present. Skin is warm and dry without rash. Neurologic: Mental status is normal, cranial nerves are intact, there are no motor or sensory deficits.  ED Results / Procedures / Treatments   Labs (all labs ordered are listed, but only abnormal results are displayed) Labs Reviewed  D-DIMER, QUANTITATIVE (NOT AT  [facility] - Abnormal; Notable for the following components:      Result Value   D-Dimer, Quant 0.60 (*)    All other components within normal limits  CBC WITH DIFFERENTIAL/PLATELET - Abnormal; Notable for the following components:   RBC 3.76 (*)    Hemoglobin 8.8 (*)    HCT 29.4 (*)    MCV 78.2 (*)    MCH 23.4 (*)    MCHC 29.9 (*)    RDW 18.9 (*)    All other components within normal limits  BASIC METABOLIC PANEL - Abnormal; Notable for the following components:   Potassium 3.3 (*)    Glucose, Bld 105 (*)    All other components within normal limits   Radiology DG Chest Port 1 View  Result Date: 12/01/2019 CLINICAL DATA:  Chest pain. EXAM: PORTABLE CHEST 1 VIEW COMPARISON:  09/25/2019 FINDINGS: 0551. Low volume film. The lungs are clear without focal pneumonia, edema, pneumothorax or pleural effusion. Cardiopericardial silhouette is at upper limits of normal for size. The visualized bony structures of the thorax show no acute abnormality. IMPRESSION: No active disease. Electronically Signed   By: Kennith Center M.D.   On: 12/01/2019 06:14    Procedures Procedures  Medications Ordered in ED Medications  ketorolac (TORADOL) 30 MG/ML injection 30 mg (has no administration in time range)    ED Course  I have reviewed the triage vital signs and the nursing notes.  Pertinent labs & imaging results that were available during my care of the patient were reviewed by me and considered in my medical decision making (see chart for details).  MDM Rules/Calculators/A&P Upper back pain which seems musculoskeletal.  However, given history of pulmonary embolism and patient's stating that this feels similar, will need to screen for pulmonary embolism with D-dimer.  In the meantime, will give therapeutic trial of ketorolac.  Old records were reviewed, and she had a similar presentation in March 2020 with negative work-up.  She does have 2 vascular ultrasound studies in 2017 showing DVT, but vascular  ultrasound September 18, 2019 showed no DVT.  CT angiogram of the chest in 2006 showed questionable pulmonary embolism in a segmental branch of the right lower lobe pulmonary artery, but all subsequent CT angiograms have shown no pulmonary embolism.  Chest x-ray shows no acute process.  Labs show stable anemia, mild hypokalemia and she is given a dose of oral potassium.  D-dimer is minimally elevated at  0.60, but will need to proceed with a CT angiogram.  Case is signed out to Dr. Bernette Mayers.  Final Clinical Impression(s) / ED Diagnoses Final diagnoses:  Upper back pain on left side  Microcytic anemia  Hypokalemia  Elevated d-dimer    Rx / DC Orders ED Discharge Orders    None       Dione Booze, MD 12/01/19 [phone]

## 2019-12-01 NOTE — ED Notes (Signed)
Patient returns from CT

## 2019-12-01 NOTE — ED Provider Notes (Signed)
Care of the patient assumed at the change of shift. She is here for MSK/pleuritic upper back pain also reportedly similar to previous PE while pregnant. No longer on anticoagulation. Vitals unremarkable.  Physical Exam  BP (!) 153/95 (BP Location: Left Arm)   Pulse 95   Temp 98.4 F (36.9 C) (Oral)   Resp 18   Ht 5\' 4"  (1.626 m)   Wt 75 kg   LMP 11/17/2019   SpO2 97%   BMI 28.38 kg/m   Physical Exam Resting comfortable No distress Respirations even and unlabored.  ED Course/Procedures     Procedures  MDM  Dimer pending, imaging or dispo per that result.   7:14 AM Dimer mildly elevated, will send for CTA. Patient reports pain improved.   8:18 AM CTA neg for PE, will d/c with NSAIDs, flexeril and PCP follow up.       11/19/2019, MD 12/01/19 [phone]

## 2020-09-17 ENCOUNTER — Emergency Department (HOSPITAL_COMMUNITY): Payer: Medicaid Other

## 2020-09-17 ENCOUNTER — Emergency Department (HOSPITAL_COMMUNITY)
Admission: EM | Admit: 2020-09-17 | Discharge: 2020-09-18 | Disposition: A | Discharge status: Home / Self Care | Payer: Medicaid Other | Attending: Emergency Medicine | Admitting: Emergency Medicine

## 2020-09-17 ENCOUNTER — Encounter (HOSPITAL_COMMUNITY): Payer: Self-pay

## 2020-09-17 ENCOUNTER — Other Ambulatory Visit: Payer: Self-pay

## 2020-09-17 DIAGNOSIS — R0789 Other chest pain: Secondary | ICD-10-CM | POA: Insufficient documentation

## 2020-09-17 DIAGNOSIS — I10 Essential (primary) hypertension: Secondary | ICD-10-CM | POA: Insufficient documentation

## 2020-09-17 DIAGNOSIS — R079 Chest pain, unspecified: Secondary | ICD-10-CM

## 2020-09-17 DIAGNOSIS — F17210 Nicotine dependence, cigarettes, uncomplicated: Secondary | ICD-10-CM | POA: Insufficient documentation

## 2020-09-17 DIAGNOSIS — Z20822 Contact with and (suspected) exposure to covid-19: Secondary | ICD-10-CM | POA: Diagnosis not present

## 2020-09-17 DIAGNOSIS — R519 Headache, unspecified: Secondary | ICD-10-CM | POA: Insufficient documentation

## 2020-09-17 LAB — BASIC METABOLIC PANEL
Anion gap: 6 (ref 5–15)
BUN: 8 mg/dL (ref 6–20)
CO2: 28 mmol/L (ref 22–32)
Calcium: 8.8 mg/dL — ABNORMAL LOW (ref 8.9–10.3)
Chloride: 104 mmol/L (ref 98–111)
Creatinine, Ser: 0.7 mg/dL (ref 0.44–1.00)
GFR, Estimated: >60 mL/min (ref >60)
Glucose, Bld: 121 mg/dL — ABNORMAL HIGH (ref 70–99)
Potassium: 3.5 mmol/L (ref 3.5–5.1)
Sodium: 138 mmol/L (ref 135–145)

## 2020-09-17 LAB — CBC
HCT: 31.3 % — ABNORMAL LOW (ref 36.0–46.0)
Hemoglobin: 9.4 g/dL — ABNORMAL LOW (ref 12.0–15.0)
MCH: 25 pg — ABNORMAL LOW (ref 26.0–34.0)
MCHC: 30 g/dL (ref 30.0–36.0)
MCV: 83.2 fL (ref 80.0–100.0)
Platelets: 292 10*3/uL (ref 150–400)
RBC: 3.76 MIL/uL — ABNORMAL LOW (ref 3.87–5.11)
RDW: 17.9 % — ABNORMAL HIGH (ref 11.5–15.5)
WBC: 4.6 10*3/uL (ref 4.0–10.5)
nRBC: 0 % (ref 0.0–0.2)

## 2020-09-17 LAB — PROTIME-INR
INR: 1 (ref 0.8–1.2)
Prothrombin Time: 12.9 seconds (ref 11.4–15.2)

## 2020-09-17 LAB — TROPONIN I (HIGH SENSITIVITY): Troponin I (High Sensitivity): 3 ng/L (ref <18)

## 2020-09-17 LAB — D-DIMER, QUANTITATIVE: D-Dimer, Quant: 0.62 ug/mL-FEU — ABNORMAL HIGH (ref 0.00–0.50)

## 2020-09-17 MED ORDER — IOHEXOL 350 MG/ML SOLN
100.0000 mL | Freq: Once | INTRAVENOUS | Status: AC | PRN
  Administered 2020-09-18: 100 mL via INTRAVENOUS

## 2020-09-17 MED ORDER — DIPHENHYDRAMINE HCL 50 MG/ML IJ SOLN
25.0000 mg | Freq: Once | INTRAMUSCULAR | Status: AC
  Administered 2020-09-18: 25 mg via INTRAVENOUS
  Filled 2020-09-17: qty 1

## 2020-09-17 MED ORDER — PROCHLORPERAZINE EDISYLATE 10 MG/2ML IJ SOLN
10.0000 mg | Freq: Once | INTRAMUSCULAR | Status: AC
  Administered 2020-09-18: 10 mg via INTRAVENOUS
  Filled 2020-09-17: qty 2

## 2020-09-17 MED ORDER — KETOROLAC TROMETHAMINE 30 MG/ML IJ SOLN
15.0000 mg | Freq: Once | INTRAMUSCULAR | Status: AC
  Administered 2020-09-18: 15 mg via INTRAVENOUS
  Filled 2020-09-17: qty 1

## 2020-09-17 NOTE — ED Provider Notes (Signed)
[facility] EMERGENCY DEPARTMENT Provider Note   CSN: [id number] Arrival date & time: 09/17/20  1904     History Chief Complaint  Patient presents with   Chest Pain    [name] is a 45 y.o. female.  Patient presents to the emergency department for evaluation of chest pain and headache.  Patient reports that symptoms began yesterday and have been persistent.  She has a throbbing global headache with sensitivity to light.  No previous diagnosis of migraines.  She does get frequent headaches but usually they go away when she takes OTC pain medication.  Patient also reports chest heaviness.  She has a dull, constant heaviness across her chest that started yesterday also.  Since then she has noticed that she has a sharp component of pain whenever she takes a deep breath in.       Past Medical History:  Diagnosis Date   Anemia    Chronic back pain    Hypertension    PE (pulmonary embolism) 09/2004   while pregnant   Sciatica     Patient Active Problem List   Diagnosis Date Noted   Abscess of tonsil 05/23/2013    Past Surgical History:  Procedure Laterality Date   CHOLECYSTECTOMY     TUBAL LIGATION       OB History     Gravida  9   Para  7   Term  7   Preterm      AB  2   Living  7      SAB      IAB      Ectopic      Multiple      Live Births              History reviewed. No pertinent family history.  Social History   Tobacco Use   Smoking status: Every Day    Packs/day: 0.50    Types: Cigarettes   Smokeless tobacco: Never  Vaping Use   Vaping Use: Never used  Substance Use Topics   Alcohol use: Yes   Drug use: Not Currently    Home Medications Prior to Admission medications   Medication Sig Start Date End Date Taking? Authorizing Provider  cyclobenzaprine (FLEXERIL) 10 MG tablet Take 1 tablet (10 mg total) by mouth 2 (two) times daily as needed for muscle spasms. 12/01/19   Pollyann Savoy, MD  ibuprofen (ADVIL) 600  MG tablet Take 1 tablet (600 mg total) by mouth 3 (three) times daily. 12/01/19   Pollyann Savoy, MD    Allergies    Penicillins  Review of Systems   Review of Systems  Eyes:  Positive for photophobia.  Cardiovascular:  Positive for chest pain.  Gastrointestinal:  Positive for nausea.  Neurological:  Positive for headaches.  All other systems reviewed and are negative.  Physical Exam Updated Vital Signs BP (!) 146/74   Pulse 67   Temp 97.8 F (36.6 C) (Oral)   Resp 16   Ht 5\' 4"  (1.626 m)   Wt 79.8 kg   LMP 09/13/2020 (Exact Date)   SpO2 98%   BMI 30.21 kg/m   Physical Exam Vitals and nursing note reviewed.  Constitutional:      General: She is not in acute distress.    Appearance: Normal appearance. She is well-developed.  HENT:     Head: Normocephalic and atraumatic.     Right Ear: Hearing normal.     Left Ear: Hearing normal.  Nose: Nose normal.  Eyes:     Conjunctiva/sclera: Conjunctivae normal.     Pupils: Pupils are equal, round, and reactive to light.  Cardiovascular:     Rate and Rhythm: Regular rhythm.     Heart sounds: S1 normal and S2 normal. No murmur heard.   No friction rub. No gallop.  Pulmonary:     Effort: Pulmonary effort is normal. No respiratory distress.     Breath sounds: Normal breath sounds.  Chest:     Chest wall: Tenderness present.  Abdominal:     General: Bowel sounds are normal.     Palpations: Abdomen is soft.     Tenderness: There is no abdominal tenderness. There is no guarding or rebound. Negative signs include Murphy's sign and McBurney's sign.     Hernia: No hernia is present.  Musculoskeletal:        General: Normal range of motion.     Cervical back: Normal range of motion and neck supple.  Skin:    General: Skin is warm and dry.     Findings: No rash.  Neurological:     Mental Status: She is alert and oriented to person, place, and time.     GCS: GCS eye subscore is 4. GCS verbal subscore is 5. GCS motor  subscore is 6.     Cranial Nerves: No cranial nerve deficit.     Sensory: No sensory deficit.     Coordination: Coordination normal.  Psychiatric:        Speech: Speech normal.        Behavior: Behavior normal.        Thought Content: Thought content normal.    ED Results / Procedures / Treatments   Labs (all labs ordered are listed, but only abnormal results are displayed) Labs Reviewed  BASIC METABOLIC PANEL - Abnormal; Notable for the following components:      Result Value   Glucose, Bld 121 (*)    Calcium 8.8 (*)    All other components within normal limits  CBC - Abnormal; Notable for the following components:   RBC 3.76 (*)    Hemoglobin 9.4 (*)    HCT 31.3 (*)    MCH 25.0 (*)    RDW 17.9 (*)    All other components within normal limits  D-DIMER, QUANTITATIVE - Abnormal; Notable for the following components:   D-Dimer, Quant 0.62 (*)    All other components within normal limits  SARS CORONAVIRUS 2 (TAT 6-24 HRS)  PROTIME-INR  TROPONIN I (HIGH SENSITIVITY)  TROPONIN I (HIGH SENSITIVITY)    EKG EKG Interpretation  Date/Time:  Friday September 17 2020 20:34:35 EDT Ventricular Rate:  67 PR Interval:  160 QRS Duration: 76 QT Interval:  434 QTC Calculation: 458 R Axis:   18 Text Interpretation: Normal sinus rhythm Normal ECG No significant change since last tracing Confirmed by Susy Frizzle [phone]) on 09/17/2020 9:19:57 PM  Radiology CT HEAD WO CONTRAST  Result Date: 09/18/2020 CLINICAL DATA:  Headache, intracranial hemorrhage suspected EXAM: CT HEAD WITHOUT CONTRAST TECHNIQUE: Contiguous axial images were obtained from the base of the skull through the vertex without intravenous contrast. COMPARISON:  Head CT 521 FINDINGS: Brain: No intracranial hemorrhage, mass effect, or midline shift. No hydrocephalus. The basilar cisterns are patent. No evidence of territorial infarct or acute ischemia. No extra-axial or intracranial fluid collection. Vascular: No hyperdense  vessel or unexpected calcification. Skull: No fracture or focal lesion. Sinuses/Orbits: Paranasal sinuses and mastoid air cells are clear. The  visualized orbits are unremarkable. Other: None. IMPRESSION: Negative noncontrast head CT. Electronically Signed   By: Narda Rutherford M.D.   On: 09/18/2020 01:07   CT Angio Chest Pulmonary Embolism (PE) W or WO Contrast  Result Date: 09/18/2020 CLINICAL DATA:  Chest pain. Nausea and headache. History of pulmonary embolus. EXAM: CT ANGIOGRAPHY CHEST WITH CONTRAST TECHNIQUE: Multidetector CT imaging of the chest was performed using the standard protocol during bolus administration of intravenous contrast. Multiplanar CT image reconstructions and MIPs were obtained to evaluate the vascular anatomy. CONTRAST:  OMNIPAQUE IOHEXOL 350 MG/ML SOLN COMPARISON:  Radiograph yesterday.  Chest CTA 12/01/2019 FINDINGS: Cardiovascular: There are no filling defects within the pulmonary arteries to suggest pulmonary embolus. The thoracic aorta is normal in caliber. No dissection. Common origin of the brachiocephalic and left common carotid artery, variant arch anatomy. Upper normal heart size. No pericardial effusion. Mediastinum/Nodes: Moderate-sized hiatal hernia. Mild wall thickening of the distal esophagus. No mediastinal or hilar adenopathy. No thyroid nodule. Lungs/Pleura: Linear dependent subsegmental atelectasis or scarring in the lower lobes. No pneumonia or acute airspace disease. No findings of pulmonary edema. No pleural fluid. Tiny left apical pulmonary nodule, series 6, image 10, stable dating back to 04/10/2017 chest CT a, considered benign. No further follow-up is needed. Trachea and central bronchi are patent. Upper Abdomen: No acute or unexpected findings. Musculoskeletal: There are no acute or suspicious osseous abnormalities. No chest wall soft tissue abnormality. Review of the MIP images confirms the above findings. IMPRESSION: 1. No pulmonary embolus or acute  intrathoracic abnormality. 2. Moderate-sized hiatal hernia. Mild wall thickening of the distal esophagus, can be seen with reflux or esophagitis. Electronically Signed   By: Narda Rutherford M.D.   On: 09/18/2020 01:01   DG Chest Portable 1 View  Result Date: 09/17/2020 CLINICAL DATA:  Chest pain EXAM: PORTABLE CHEST 1 VIEW COMPARISON:  12/01/2019 FINDINGS: The heart size and mediastinal contours are within normal limits. Both lungs are clear. The visualized skeletal structures are unremarkable. IMPRESSION: Normal study. Electronically Signed   By: Charlett Nose M.D.   On: 09/17/2020 22:02    Procedures Procedures   Medications Ordered in ED Medications  ketorolac (TORADOL) 30 MG/ML injection 15 mg (15 mg Intravenous Given 09/18/20 0025)  prochlorperazine (COMPAZINE) injection 10 mg (10 mg Intravenous Given 09/18/20 0022)  diphenhydrAMINE (BENADRYL) injection 25 mg (25 mg Intravenous Given 09/18/20 0024)  iohexol (OMNIPAQUE) 350 MG/ML injection 100 mL (100 mLs Intravenous Contrast Given 09/18/20 0039)    ED Course  I have reviewed the triage vital signs and the nursing notes.  Pertinent labs & imaging results that were available during my care of the patient were reviewed by me and considered in my medical decision making (see chart for details).    MDM Rules/Calculators/A&P                           Patient presents to the emergency department for evaluation of chest pain.  She has had a headache as well.  Patient reports throbbing global headache with some light sensitivity.  No previous history of migraines.  CT head unremarkable.  Patient describes a heaviness in the chest with a sharp component when she takes a deep breath.  There is some reproducibility with palpation.  She does, however, have a history of PE and is not anticoagulated.  D-dimer was elevated so she underwent CT angiography.  No PE noted.  Patient's grandson was in the emergency  department earlier and his COVID swab just  came back positive.  Patient's current symptoms could potentially be secondary to COVID.  We will test.  She will be appropriate for discharge, outpatient follow-up.  Final Clinical Impression(s) / ED Diagnoses Final diagnoses:  Chest pain, unspecified type  Bad headache    Rx / DC Orders ED Discharge Orders     None        Akeiba Axelson, Canary Brimhristopher J, MD 09/18/20 [phone]

## 2020-09-17 NOTE — ED Triage Notes (Signed)
Pt arrived via POV c/o chest pain since yesterday. Pt reports nausea, headache and pain worsens with deep breaths. Pt reports previous Hx of blood clots.

## 2020-09-18 LAB — TROPONIN I (HIGH SENSITIVITY): Troponin I (High Sensitivity): 2 ng/L (ref <18)

## 2020-09-18 LAB — SARS CORONAVIRUS 2 (TAT 6-24 HRS): SARS Coronavirus 2: NEGATIVE

## 2020-09-18 NOTE — ED Notes (Signed)
Patient transported to CT 

## 2020-10-02 ENCOUNTER — Other Ambulatory Visit: Payer: Self-pay

## 2020-10-02 ENCOUNTER — Emergency Department (HOSPITAL_COMMUNITY)
Admission: EM | Admit: 2020-10-02 | Discharge: 2020-10-02 | Disposition: A | Discharge status: Home / Self Care | Payer: Medicaid Other

## 2020-11-09 ENCOUNTER — Other Ambulatory Visit: Payer: Self-pay

## 2020-11-09 ENCOUNTER — Ambulatory Visit (INDEPENDENT_AMBULATORY_CARE_PROVIDER_SITE_OTHER): Payer: Medicaid Other | Admitting: Orthopaedic Surgery

## 2020-11-09 ENCOUNTER — Encounter: Payer: Self-pay | Admitting: Orthopaedic Surgery

## 2020-11-09 ENCOUNTER — Ambulatory Visit: Payer: Medicaid Other

## 2020-11-09 VITALS — BP 189/108 | HR 89 | Ht 65.0 in | Wt 179.4 lb

## 2020-11-09 DIAGNOSIS — M545 Low back pain, unspecified: Secondary | ICD-10-CM

## 2020-11-09 MED ORDER — PREDNISONE 10 MG (21) PO TBPK: ORAL_TABLET | ORAL | 1 refills | Status: DC

## 2020-11-09 MED ORDER — NAPROXEN 500 MG PO TABS: 500.0000 mg | ORAL_TABLET | Freq: Two times a day (BID) | ORAL | 5 refills | Status: DC

## 2020-11-09 MED ORDER — CYCLOBENZAPRINE HCL 10 MG PO TABS: 10.0000 mg | ORAL_TABLET | Freq: Every day | ORAL | 0 refills | Status: DC

## 2020-11-09 MED ORDER — HYDROCODONE-ACETAMINOPHEN 5-325 MG PO TABS: ORAL_TABLET | ORAL | 0 refills | Status: DC

## 2020-11-09 NOTE — Patient Instructions (Signed)

## 2020-11-09 NOTE — Progress Notes (Signed)
Subjective:    Patient ID: [name], female    DOB: Aug 05, 1975, 45 y.o.   MRN: [id number]  HPI She has had increasing lower back pain for six to eight weeks getting worse. She has pain in the right lower back with no radiation.  She cannot do her housework or care for her children or grandchildren.  She has no trauma, no weakness but has had some spasm.  Nothing helps. She has tried ice, heat and rubs as well as Tylenol and Advil.  She has had massage.  She is tried of hurting and rather emotional today.   Review of Systems  Constitutional:  Positive for activity change.  Musculoskeletal:  Positive for arthralgias and back pain.  All other systems reviewed and are negative. For Review of Systems, all other systems reviewed and are negative.  The following is a summary of the past history medically, past history surgically, known current medicines, social history and family history.  This information is gathered electronically by the computer from prior information and documentation.  I review this each visit and have found including this information at this point in the chart is beneficial and informative.   Past Medical History:  Diagnosis Date   Anemia    Chronic back pain    Hypertension    PE (pulmonary embolism) 09/2004   while pregnant   Sciatica     Past Surgical History:  Procedure Laterality Date   CHOLECYSTECTOMY     TUBAL LIGATION      No current outpatient medications on file prior to visit.   No current facility-administered medications on file prior to visit.    Social History   Socioeconomic History   Marital status: Single    Spouse name: Not on file   Number of children: Not on file   Years of education: Not on file   Highest education level: Not on file  Occupational History   Not on file  Tobacco Use   Smoking status: Every Day    Packs/day: 0.50    Types: Cigarettes   Smokeless tobacco: Never  Vaping Use   Vaping Use: Never used   Substance and Sexual Activity   Alcohol use: Yes   Drug use: Not Currently   Sexual activity: Yes    Birth control/protection: Surgical  Other Topics Concern   Not on file  Social History Narrative   Not on file   Social Determinants of Health   Financial Resource Strain: Not on file  Food Insecurity: Not on file  Transportation Needs: Not on file  Physical Activity: Not on file  Stress: Not on file  Social Connections: Not on file  Intimate Partner Violence: Not on file    History reviewed. No pertinent family history.  BP (!) 189/108   Pulse 89   Ht 5\' 5"  (1.651 m)   Wt 179 lb 6.4 oz (81.4 kg)   LMP 10/27/2020 (Exact Date)   BMI 29.85 kg/m   Body mass index is 29.85 kg/m.     Objective:   Physical Exam Vitals and nursing note reviewed. Exam conducted with a chaperone present.  Constitutional:      Appearance: She is well-developed.  HENT:     Head: Normocephalic and atraumatic.  Eyes:     Conjunctiva/sclera: Conjunctivae normal.     Pupils: Pupils are equal, round, and reactive to light.  Cardiovascular:     Rate and Rhythm: Normal rate and regular rhythm.  Pulmonary:  Effort: Pulmonary effort is normal.  Abdominal:     Palpations: Abdomen is soft.  Musculoskeletal:     Cervical back: Normal range of motion and neck supple.       Back:  Skin:    General: Skin is warm and dry.  Neurological:     Mental Status: She is alert and oriented to person, place, and time.     Cranial Nerves: No cranial nerve deficit.     Motor: No abnormal muscle tone.     Coordination: Coordination normal.     Deep Tendon Reflexes: Reflexes are normal and symmetric. Reflexes normal.  Psychiatric:        Behavior: Behavior normal.        Thought Content: Thought content normal.        Judgment: Judgment normal.  X-rays were done of the lumbar spine, reported separately.  Negative.        Assessment & Plan:   Encounter Diagnosis  Name Primary?   Lumbar back  pain Yes   I will give pain medicine, Flexeril, prednisone dose pack and then begin Naprosyn when dose pack completed.  Exercises for back given.  Return in two weeks.  I have reviewed the [place] Controlled Substance Reporting System web site prior to prescribing narcotic medicine for this patient.  Call if any problem.  Precautions discussed.  Electronically Signed Darreld Mclean, MD 9/20/202211:04 AM

## 2020-11-23 ENCOUNTER — Other Ambulatory Visit: Payer: Self-pay

## 2020-11-23 ENCOUNTER — Encounter: Payer: Self-pay | Admitting: Orthopaedic Surgery

## 2020-11-23 ENCOUNTER — Ambulatory Visit (INDEPENDENT_AMBULATORY_CARE_PROVIDER_SITE_OTHER): Payer: Medicaid Other | Admitting: Orthopaedic Surgery

## 2020-11-23 VITALS — BP 159/98 | HR 96 | Ht 64.0 in | Wt 185.0 lb

## 2020-11-23 DIAGNOSIS — M545 Low back pain, unspecified: Secondary | ICD-10-CM

## 2020-11-23 MED ORDER — CYCLOBENZAPRINE HCL 10 MG PO TABS: 10.0000 mg | ORAL_TABLET | Freq: Every day | ORAL | 0 refills | Status: DC

## 2020-11-23 MED ORDER — HYDROCODONE-ACETAMINOPHEN 5-325 MG PO TABS: ORAL_TABLET | ORAL | 0 refills | Status: DC

## 2020-11-23 NOTE — Progress Notes (Signed)
My back is only a little better.  She has lower back pain but no weakness or numbness. She is a little better with the pain medicine.  She is doing her exercises.  She has no new trauma.  Spine/Pelvis examination:  Inspection:  Overall, sacoiliac joint benign and hips nontender; without crepitus or defects.   Thoracic spine inspection: Alignment normal without kyphosis present   Lumbar spine inspection:  Alignment  with normal lumbar lordosis, without scoliosis apparent.   Thoracic spine palpation:  without tenderness of spinal processes   Lumbar spine palpation: without tenderness of lumbar area; without tightness of lumbar muscles    Range of Motion:   Lumbar flexion, forward flexion is normal without pain or tenderness    Lumbar extension is full without pain or tenderness   Left lateral bend is normal without pain or tenderness   Right lateral bend is normal without pain or tenderness   Straight leg raising is normal  Strength & tone: normal   Stability overall normal stability  Encounter Diagnosis  Name Primary?   Lumbar back pain Yes   I have reviewed the [place] Controlled Substance Reporting System web site prior to prescribing narcotic medicine for this patient.  I told her to continue the Flexeril.  I will refill that.  Return in one month.  Continue the exercises.  Call if any problem.  Precautions discussed.  Electronically Signed Darreld Mclean, MD 10/4/20229:45 AM

## 2020-11-24 ENCOUNTER — Encounter: Payer: Self-pay | Admitting: Radiology

## 2020-11-24 NOTE — Progress Notes (Signed)
Pharmacy faxed that Rx needs auth.  I submitted auth online covermymeds.  Key [id number], copy left in Betty's box so she can f/u on it.

## 2020-12-21 ENCOUNTER — Ambulatory Visit: Payer: Medicaid Other | Admitting: Orthopaedic Surgery

## 2020-12-23 ENCOUNTER — Ambulatory Visit: Payer: Medicaid Other | Admitting: Orthopaedic Surgery

## 2020-12-23 ENCOUNTER — Encounter: Payer: Self-pay | Admitting: Orthopaedic Surgery

## 2020-12-23 ENCOUNTER — Other Ambulatory Visit: Payer: Self-pay

## 2020-12-23 VITALS — BP 141/99 | HR 92 | Ht 64.0 in | Wt 180.0 lb

## 2020-12-23 DIAGNOSIS — M545 Low back pain, unspecified: Secondary | ICD-10-CM | POA: Diagnosis not present

## 2020-12-23 MED ORDER — HYDROCODONE-ACETAMINOPHEN 5-325 MG PO TABS: ORAL_TABLET | ORAL | 0 refills | Status: DC

## 2020-12-23 NOTE — Progress Notes (Signed)
I am worse.  Her lower back pain is worse.  She has some pain to the right knee area and below now from her back.  She has no weakness, no new trauma.  She is taking her medicine.  Spine/Pelvis examination:  Inspection:  Overall, sacoiliac joint benign and hips nontender; without crepitus or defects.   Thoracic spine inspection: Alignment normal without kyphosis present   Lumbar spine inspection:  Alignment  with normal lumbar lordosis, without scoliosis apparent.   Thoracic spine palpation:  without tenderness of spinal processes   Lumbar spine palpation: without tenderness of lumbar area; without tightness of lumbar muscles    Range of Motion:   Lumbar flexion, forward flexion is normal without pain or tenderness    Lumbar extension is full without pain or tenderness   Left lateral bend is normal without pain or tenderness   Right lateral bend is normal without pain or tenderness   Straight leg raising is normal  Strength & tone: normal   Stability overall normal stability  Encounter Diagnosis  Name Primary?   Lumbar back pain Yes   I will begin PT.  She may need MRI.  I have reviewed the [place] Controlled Substance Reporting System web site prior to prescribing narcotic medicine for this patient.  Call if any problem. Return in two weeks.  Precautions discussed.  Electronically Signed Darreld Mclean, MD 11/3/202210:02 AM

## 2021-01-05 ENCOUNTER — Ambulatory Visit (HOSPITAL_COMMUNITY): Payer: Medicaid Other

## 2021-01-06 ENCOUNTER — Encounter (HOSPITAL_COMMUNITY): Payer: Self-pay | Admitting: Physical Therapy

## 2021-01-06 ENCOUNTER — Encounter: Payer: Self-pay | Admitting: Orthopaedic Surgery

## 2021-01-06 ENCOUNTER — Ambulatory Visit: Payer: Medicaid Other | Admitting: Orthopaedic Surgery

## 2021-01-06 ENCOUNTER — Ambulatory Visit (HOSPITAL_COMMUNITY): Payer: Medicaid Other | Attending: Orthopaedic Surgery | Admitting: Physical Therapy

## 2021-01-06 ENCOUNTER — Other Ambulatory Visit: Payer: Self-pay

## 2021-01-06 VITALS — Ht 64.0 in | Wt 177.0 lb

## 2021-01-06 DIAGNOSIS — M545 Low back pain, unspecified: Secondary | ICD-10-CM

## 2021-01-06 DIAGNOSIS — M6281 Muscle weakness (generalized): Secondary | ICD-10-CM

## 2021-01-06 DIAGNOSIS — G8929 Other chronic pain: Secondary | ICD-10-CM | POA: Insufficient documentation

## 2021-01-06 DIAGNOSIS — R262 Difficulty in walking, not elsewhere classified: Secondary | ICD-10-CM | POA: Diagnosis present

## 2021-01-06 MED ORDER — CYCLOBENZAPRINE HCL 10 MG PO TABS: 10.0000 mg | ORAL_TABLET | Freq: Every day | ORAL | 0 refills | Status: DC

## 2021-01-06 MED ORDER — DICLOFENAC SODIUM 75 MG PO TBEC: 75.0000 mg | DELAYED_RELEASE_TABLET | Freq: Two times a day (BID) | ORAL | 2 refills | Status: DC

## 2021-01-06 MED ORDER — HYDROCODONE-ACETAMINOPHEN 5-325 MG PO TABS: ORAL_TABLET | ORAL | 0 refills | Status: DC
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
## 2021-01-06 NOTE — Progress Notes (Signed)
I am worse.  She has had more back pain with the colder weather. She has no numbness. PT will start today this afternoon. She has no new trauma. She has no weakness.  Spine/Pelvis examination:  Inspection:  Overall, sacoiliac joint benign and hips nontender; without crepitus or defects.   Thoracic spine inspection: Alignment normal without kyphosis present   Lumbar spine inspection:  Alignment  with normal lumbar lordosis, without scoliosis apparent.   Thoracic spine palpation:  without tenderness of spinal processes   Lumbar spine palpation: without tenderness of lumbar area; without tightness of lumbar muscles    Range of Motion:   Lumbar flexion, forward flexion is normal without pain or tenderness    Lumbar extension is full without pain or tenderness   Left lateral bend is normal without pain or tenderness   Right lateral bend is normal without pain or tenderness   Straight leg raising is normal  Strength & tone: normal   Stability overall normal stability  Encounter Diagnosis  Name Primary?   Lumbar back pain Yes   I have reviewed the [place] Controlled Substance Reporting System web site prior to prescribing narcotic medicine for this patient.  I will stop Naproysn, begin diclofenac.  I will renew Flexeril.  Return in two weeks.  Go to PT.  Call if any problem.  Precautions discussed.  Electronically Signed Darreld Mclean, MD 11/17/202211:00 AM

## 2021-01-07 ENCOUNTER — Telehealth (HOSPITAL_COMMUNITY): Payer: Self-pay | Admitting: Physical Therapy

## 2021-01-07 ENCOUNTER — Ambulatory Visit (HOSPITAL_COMMUNITY): Payer: Medicaid Other | Admitting: Physical Therapy

## 2021-01-07 NOTE — Telephone Encounter (Signed)
NS. Called patient and she got her times mixed up. Confirmed next apt with patient.   4:21 PM, 01/07/21 Tereasa Coop, DPT Physical Therapy with [facility]  [phone] office

## 2021-01-10 ENCOUNTER — Other Ambulatory Visit: Payer: Self-pay

## 2021-01-10 ENCOUNTER — Ambulatory Visit (HOSPITAL_COMMUNITY): Payer: Medicaid Other | Admitting: Physical Therapy

## 2021-01-10 ENCOUNTER — Encounter (HOSPITAL_COMMUNITY): Payer: Self-pay | Admitting: Physical Therapy

## 2021-01-10 DIAGNOSIS — M545 Low back pain, unspecified: Secondary | ICD-10-CM | POA: Diagnosis not present

## 2021-01-10 DIAGNOSIS — G8929 Other chronic pain: Secondary | ICD-10-CM

## 2021-01-10 DIAGNOSIS — M6281 Muscle weakness (generalized): Secondary | ICD-10-CM

## 2021-01-10 DIAGNOSIS — R262 Difficulty in walking, not elsewhere classified: Secondary | ICD-10-CM

## 2021-01-10 NOTE — Patient Instructions (Signed)
Access Code: [email or web address] URL: [email or web address] Date: 01/10/2021 Prepared by: Georges Lynch  Exercises Supine Transversus Abdominis Bracing - Hands on Stomach - 3 x daily - 7 x weekly - 2 sets - 10 reps - 5 second hold Supine Lower Trunk Rotation - 3 x daily - 7 x weekly - 1 sets - 10 reps - 5 second hold

## 2021-01-10 NOTE — Therapy (Signed)
[facility] [address] [place], [place], [address] Phone: [phone]   Fax:  [phone]  Physical Therapy Treatment  Patient Details  Name: [name] MRN: [id number] Date of Birth: 11/27/1975 Referring Provider (PT): v   Encounter Date: 01/10/2021   PT End of Session - 01/10/21 1658     Visit Number 2    Number of Visits 8    Date for PT Re-Evaluation 03/03/21    Authorization Type [place]  medicaid Healthy blue - auth requested    Authorization Time Period pending    PT Start Time 1650    PT Stop Time 1728    PT Time Calculation (min) 38 min    Activity Tolerance Patient limited by pain    Behavior During Therapy [facility] for tasks assessed/performed             Past Medical History:  Diagnosis Date   Anemia    Chronic back pain    Hypertension    PE (pulmonary embolism) 09/2004   while pregnant   Sciatica     Past Surgical History:  Procedure Laterality Date   CHOLECYSTECTOMY     TUBAL LIGATION      There were no vitals filed for this visit.   Subjective Assessment - 01/10/21 1657     Subjective Patient says home exercise is good. She has been practicing at home. Pain in back is" terrible today". She states she can barely move.    Limitations Sitting;Walking;House hold activities;Standing    Patient Stated Goals to have less pain    Currently in Pain? Yes    Pain Score 8     Pain Location Back    Pain Orientation Lower;Posterior    Pain Descriptors / Indicators Aching;Constant    Pain Type Chronic pain    Pain Onset More than a month ago                               [facility] Adult PT Treatment/Exercise - 01/10/21 0001       Lumbar Exercises: Stretches   Lower Trunk Rotation 5 reps;10 seconds      Lumbar Exercises: Supine   Ab Set 15 reps    Pelvic Tilt 5 seconds;5 reps   painful   Other Supine Lumbar Exercises 90/90 positional relief - 3 minutes on wedge, diaphragmn breathing x15  deep breath in this position                       PT Short Term Goals - 01/06/21 1350       PT SHORT TERM GOAL #1   Title Patient will be able to transition from supine to sit with modified independence    Time 4    Period Weeks    Status New    Target Date 02/03/21      PT SHORT TERM GOAL #2   Title Patient will be independent in self management strategies to improve quality of life and functional outcomes.    Time 4    Period Weeks    Status New    Target Date 02/03/21      PT SHORT TERM GOAL #3   Title Patient will report at least 25% improvement in overall symptoms and/or function to demonstrate improved functional mobility    Time 4    Period Weeks    Status New  Target Date 02/03/21               PT Long Term Goals - 01/06/21 1353       PT LONG TERM GOAL #1   Title Patient will be able to transition from sit to stand without pain.    Time 8    Period Weeks    Status New    Target Date 03/03/21      PT LONG TERM GOAL #2   Title Patient will report at least 50% improvement in overall symptoms and/or function to demonstrate improved functional mobility    Time 8    Period Weeks    Status New    Target Date 03/03/21      PT LONG TERM GOAL #3   Title Patient will be able to demonstrate at least 50% of lumbar ROM in all directions    Time 8    Period Weeks    Status New    Target Date 03/03/21                   Plan - 01/10/21 1723     Clinical Impression Statement Reviewed therapy goals and HEP positioning exercise. Patient tolerated this well. Progressed core strength to include ab bracing and diaphragm breathing. Patient educated on proper form and function. Patient noting increased pain with attempted pelvic tilts. Held exercise. Issued new HEP handout. Continue work on pain free lumbar mobility and core strength for improved functional ability.    Personal Factors and Comorbidities Education;Time since onset of  injury/illness/exacerbation    Examination-Activity Limitations Bed Mobility;Locomotion Level;Transfers;Stand;Reach Overhead;Lift;Hygiene/Grooming;Dressing    Examination-Participation Restrictions Church;Cleaning;Community Activity;Shop    Stability/Clinical Decision Making Stable/Uncomplicated    Rehab Potential Fair    PT Frequency Other (comment)   1-2x/week for total of 8 visits over 8 week certification period   PT Duration 8 weeks    PT Treatment/Interventions ADLs/Self Care Home Management;Cryotherapy;Electrical Stimulation;Moist Heat;Traction;Gait training;Therapeutic activities;Therapeutic exercise;Patient/family education;Manual techniques;Joint Manipulations    PT Next Visit Plan table ROM of hip/back, isometrics, heat, modalities or manual as indicated for pain relief.    PT Home Exercise Plan 90/90 supportive position with breathing 11/21 ab brace, LTR    Consulted and Agree with Plan of Care Patient             Patient will benefit from skilled therapeutic intervention in order to improve the following deficits and impairments:  Pain, Decreased strength, Decreased activity tolerance, Decreased range of motion, Difficulty walking, Decreased mobility  Visit Diagnosis: Chronic midline low back pain without sciatica  Muscle weakness (generalized)  Difficulty in walking, not elsewhere classified     Problem List Patient Active Problem List   Diagnosis Date Noted   Abscess of tonsil 05/23/2013   5:28 PM, 01/10/21 Georges Lynch PT DPT  Physical Therapist with [facility]  [facility]  [phone]  [facility] [address] [place], [place], [address] Phone: [phone]   Fax:  [phone]  Name: [name] MRN: [id number] Date of Birth: 1975-06-04

## 2021-01-17 ENCOUNTER — Ambulatory Visit (HOSPITAL_COMMUNITY): Payer: Medicaid Other | Admitting: Physical Therapy

## 2021-01-18 ENCOUNTER — Telehealth (HOSPITAL_COMMUNITY): Payer: Self-pay | Admitting: Physical Therapy

## 2021-01-18 NOTE — Telephone Encounter (Signed)
Called patient about no show appointment # 2. Unable to leave message due to VM not set up.   5:00 PM, 01/18/21 [name] PT DPT  Physical Therapist with [facility]  [phone]

## 2021-01-20 ENCOUNTER — Other Ambulatory Visit: Payer: Self-pay

## 2021-01-20 ENCOUNTER — Encounter (HOSPITAL_COMMUNITY): Payer: Self-pay

## 2021-01-20 ENCOUNTER — Ambulatory Visit (HOSPITAL_COMMUNITY): Payer: Medicaid Other | Attending: Orthopaedic Surgery

## 2021-01-20 ENCOUNTER — Encounter: Payer: Self-pay | Admitting: Orthopaedic Surgery

## 2021-01-20 ENCOUNTER — Ambulatory Visit (INDEPENDENT_AMBULATORY_CARE_PROVIDER_SITE_OTHER): Payer: Medicaid Other | Admitting: Orthopaedic Surgery

## 2021-01-20 VITALS — BP 154/110 | HR 89 | Ht 64.0 in | Wt 184.4 lb

## 2021-01-20 DIAGNOSIS — M545 Low back pain, unspecified: Secondary | ICD-10-CM | POA: Diagnosis not present

## 2021-01-20 DIAGNOSIS — R262 Difficulty in walking, not elsewhere classified: Secondary | ICD-10-CM | POA: Insufficient documentation

## 2021-01-20 DIAGNOSIS — G8929 Other chronic pain: Secondary | ICD-10-CM | POA: Insufficient documentation

## 2021-01-20 DIAGNOSIS — M6281 Muscle weakness (generalized): Secondary | ICD-10-CM | POA: Insufficient documentation

## 2021-01-20 MED ORDER — CYCLOBENZAPRINE HCL 10 MG PO TABS: 10.0000 mg | ORAL_TABLET | Freq: Every day | ORAL | 0 refills | Status: DC

## 2021-01-20 MED ORDER — HYDROCODONE-ACETAMINOPHEN 5-325 MG PO TABS: ORAL_TABLET | ORAL | 0 refills | Status: DC

## 2021-01-20 NOTE — Addendum Note (Signed)
Addended by: Baird Kay on: 01/20/2021 11:10 AM   Modules accepted: Orders

## 2021-01-20 NOTE — Progress Notes (Signed)
I am worse.  She has been to PT and is not improving with her back pain.  She is doing the exercises at home.  She is taking her medicine.  She just is not getting any better.  She has no new trauma.  Spine/Pelvis examination:  Inspection:  Overall, sacoiliac joint benign and hips nontender; without crepitus or defects.   Thoracic spine inspection: Alignment normal without kyphosis present   Lumbar spine inspection:  Alignment  with normal lumbar lordosis, withoutscoliosis apparent.   Thoracic spine palpation:  without tenderness of spinal processes   Lumbar spine palpation: with tenderness of lumbar area; with tightness of lumbar muscles    Range of Motion:   Lumbar flexion, forward flexion is 25 with pain or tenderness    Lumbar extension is 5 with pain or tenderness   Left lateral bend is Normal  without pain or tenderness   Right lateral bend is Normal without pain or tenderness   Straight leg raising is Normal   Strength & tone: Normal   Stability overall normal stability    Encounter Diagnosis  Name Primary?   Lumbar back pain Yes   I will get MRI as she is not improving.  Continue PT.  I will refill her pain medicine and Flexeril.

## 2021-01-20 NOTE — Therapy (Signed)
[facility] [address] [place], [place], [address] Phone: [phone]   Fax:  [phone]  Physical Therapy Treatment  Patient Details  Name: [name] MRN: [id number] Date of Birth: 02-Aug-1975 Referring Provider (PT): v   Encounter Date: 01/20/2021   PT End of Session - 01/20/21 1129     Visit Number 3    Number of Visits 8    Date for PT Re-Evaluation 03/03/21    Authorization Type [place]  medicaid [employer] - auth requested    PT Start Time 1115   signed in then went outside to talk on phone, late start   PT Stop Time 1158    PT Time Calculation (min) 43 min    Activity Tolerance Patient limited by pain    Behavior During Therapy [facility] for tasks assessed/performed             Past Medical History:  Diagnosis Date   Anemia    Chronic back pain    Hypertension    PE (pulmonary embolism) 09/2004   while pregnant   Sciatica     Past Surgical History:  Procedure Laterality Date   CHOLECYSTECTOMY     TUBAL LIGATION      There were no vitals filed for this visit.   Subjective Assessment - 01/20/21 1118     Subjective Pt stated she has periodic spasms from lower back to mid back, pain scale 8/10.  Saw PCP earlier today and plan on arranging an MRI, not scheduled.  Reports she likes the ball exercises at home, helps to reduce the tightness in lower back.    Patient Stated Goals to have less pain    Currently in Pain? Yes    Pain Score 8     Pain Location Back    Pain Orientation Lower    Pain Descriptors / Indicators Aching;Constant    Pain Type Chronic pain    Pain Radiating Towards across and up and down back    Pain Onset More than a month ago    Pain Frequency Constant    Aggravating Factors  everything    Pain Relieving Factors heat                               [facility] Adult PT Treatment/Exercise - 01/20/21 0001       Lumbar Exercises: Stretches   Active Hamstring Stretch 2 reps;30  seconds    Active Hamstring Stretch Limitations supine with hands behind knees    Single Knee to Chest Stretch Right;Left;1 rep;30 seconds    Single Knee to Chest Stretch Limitations reports increased pressure on lower back with Rt LE    Lower Trunk Rotation 5 reps;10 seconds    Lower Trunk Rotation Limitations on theraball      Lumbar Exercises: Supine   Ab Set 15 reps    Heel Slides 10 reps    Heel Slides Limitations feet on theraball    Bent Knee Raise 10 reps    Bridge 5 reps    Bridge Limitations too much pressure    Other Supine Lumbar Exercises 90/90 positional relief - 3 minutes on wedge, diaphragmn breathing x15 deep breath in this position      Lumbar Exercises: Quadruped   Madcat/Old Horse 5 reps    Madcat/Old Horse Limitations 3 deep breaths    Other Quadruped Lumbar Exercises Childs pose 2x 30  Manual Therapy   Manual Therapy Manual Traction    Manual therapy comments Manual complete separate than rest of tx    Manual Traction Manual lumbar traction x 2 min wtih LE on theraball                       PT Short Term Goals - 01/06/21 1350       PT SHORT TERM GOAL #1   Title Patient will be able to transition from supine to sit with modified independence    Time 4    Period Weeks    Status New    Target Date 02/03/21      PT SHORT TERM GOAL #2   Title Patient will be independent in self management strategies to improve quality of life and functional outcomes.    Time 4    Period Weeks    Status New    Target Date 02/03/21      PT SHORT TERM GOAL #3   Title Patient will report at least 25% improvement in overall symptoms and/or function to demonstrate improved functional mobility    Time 4    Period Weeks    Status New    Target Date 02/03/21               PT Long Term Goals - 01/06/21 1353       PT LONG TERM GOAL #1   Title Patient will be able to transition from sit to stand without pain.    Time 8    Period Weeks    Status  New    Target Date 03/03/21      PT LONG TERM GOAL #2   Title Patient will report at least 50% improvement in overall symptoms and/or function to demonstrate improved functional mobility    Time 8    Period Weeks    Status New    Target Date 03/03/21      PT LONG TERM GOAL #3   Title Patient will be able to demonstrate at least 50% of lumbar ROM in all directions    Time 8    Period Weeks    Status New    Target Date 03/03/21                   Plan - 01/20/21 1152     Clinical Impression Statement Session focus on with spinal mobility and core strengthening.  Pt stated she felt her legs shaking through session though not visible motions.  Pt limited by high pain scale through session and reports of pressure and tightness.  Educated on benefits with prolonged stretches for maximal musculature lengthening and assistance with diaphramagatic breathing to calm CNS.  Reviewed log rolling to reduce irritation on lower back and importance of compliance wtih HEP for maximal benefits iwht core strengthening.    Personal Factors and Comorbidities Education;Time since onset of injury/illness/exacerbation    Examination-Activity Limitations Bed Mobility;Locomotion Level;Transfers;Stand;Reach Overhead;Lift;Hygiene/Grooming;Dressing    Examination-Participation Restrictions Church;Cleaning;Community Activity;Shop    Stability/Clinical Decision Making Stable/Uncomplicated    Clinical Decision Making Low    Rehab Potential Fair    PT Frequency --   1-2x/week for total of 8 visits over 8 week certification period   PT Duration 8 weeks    PT Treatment/Interventions ADLs/Self Care Home Management;Cryotherapy;Electrical Stimulation;Moist Heat;Traction;Gait training;Therapeutic activities;Therapeutic exercise;Patient/family education;Manual techniques;Joint Manipulations    PT Next Visit Plan table ROM of hip/back, isometrics, heat, modalities or manual as indicated for  pain relief.    PT Home  Exercise Plan 90/90 supportive position with breathing 11/21 ab brace, LTR; 12/1: cat/camel and child's pose    Consulted and Agree with Plan of Care Patient             Patient will benefit from skilled therapeutic intervention in order to improve the following deficits and impairments:  Pain, Decreased strength, Decreased activity tolerance, Decreased range of motion, Difficulty walking, Decreased mobility  Visit Diagnosis: Chronic midline low back pain without sciatica  Muscle weakness (generalized)  Difficulty in walking, not elsewhere classified     Problem List Patient Active Problem List   Diagnosis Date Noted   Abscess of tonsil 05/23/2013   Becky Sax, LPTA/CLT; CBIS [phone]  Juel Burrow, PTA 01/20/2021, 1:01 PM  [facility] [address] [place], [place], [address] Phone: [phone]   Fax:  [phone]  Name: [name] MRN: [id number] Date of Birth: Jun 24, 1975

## 2021-01-24 ENCOUNTER — Ambulatory Visit (HOSPITAL_COMMUNITY): Payer: Medicaid Other | Admitting: Physical Therapy

## 2021-01-26 ENCOUNTER — Ambulatory Visit (HOSPITAL_COMMUNITY): Payer: Medicaid Other | Admitting: Physical Therapy

## 2021-01-31 ENCOUNTER — Ambulatory Visit (HOSPITAL_COMMUNITY): Payer: Medicaid Other | Admitting: Physical Therapy

## 2021-02-01 ENCOUNTER — Telehealth (HOSPITAL_COMMUNITY): Payer: Self-pay | Admitting: Physical Therapy

## 2021-02-01 ENCOUNTER — Ambulatory Visit (HOSPITAL_COMMUNITY): Payer: Medicaid Other | Admitting: Physical Therapy

## 2021-02-01 NOTE — Telephone Encounter (Signed)
No Show. Called and and unable to leave message about missed apt.  10:24 AM, 02/01/21 Tereasa Coop, DPT Physical Therapy with [facility]  [phone] office

## 2021-02-03 ENCOUNTER — Ambulatory Visit (HOSPITAL_COMMUNITY): Payer: Medicaid Other | Admitting: Physical Therapy

## 2021-02-07 ENCOUNTER — Ambulatory Visit (HOSPITAL_COMMUNITY): Payer: Medicaid Other

## 2021-02-10 ENCOUNTER — Other Ambulatory Visit: Payer: Self-pay | Admitting: Orthopedic Surgery

## 2021-02-10 ENCOUNTER — Ambulatory Visit (HOSPITAL_COMMUNITY): Payer: Medicaid Other | Admitting: Physical Therapy

## 2021-02-10 ENCOUNTER — Ambulatory Visit: Payer: Medicaid Other | Admitting: Orthopaedic Surgery

## 2021-02-10 ENCOUNTER — Telehealth: Payer: Self-pay | Admitting: Orthopaedic Surgery

## 2021-02-10 MED ORDER — HYDROCODONE-ACETAMINOPHEN 5-325 MG PO TABS: ORAL_TABLET | ORAL | 0 refills | Status: DC

## 2021-02-10 NOTE — Telephone Encounter (Signed)
Patient had called this morning 9:28am, cancelled and rescheduled today's appointment due to MRI not yet being done. At the time, patient had asked for a refill of her medication:    HYDROcodone-acetaminophen (NORCO/VICODIN) 5-325 MG tablet  28 tablet              [facility], [address], [place]*  *patient aware Dr Hilda Lias is now out of clinic until 02/22/21.

## 2021-02-10 NOTE — Progress Notes (Signed)
Meds ordered this encounter  Medications   HYDROcodone-acetaminophen (NORCO/VICODIN) 5-325 MG tablet    Sig: One tablet every six hours for pain.  Limit 7 days.    Dispense:  28 tablet    Refill:  0    

## 2021-02-15 ENCOUNTER — Ambulatory Visit (HOSPITAL_COMMUNITY): Payer: Medicaid Other

## 2021-02-18 ENCOUNTER — Ambulatory Visit (HOSPITAL_COMMUNITY): Payer: Medicaid Other

## 2021-02-22 ENCOUNTER — Ambulatory Visit (HOSPITAL_COMMUNITY)
Admission: RE | Admit: 2021-02-22 | Discharge: 2021-02-22 | Disposition: A | Discharge status: Home / Self Care | Payer: Medicaid Other | Source: Ambulatory Visit | Attending: Orthopaedic Surgery | Admitting: Orthopaedic Surgery

## 2021-02-22 ENCOUNTER — Other Ambulatory Visit: Payer: Self-pay

## 2021-02-22 DIAGNOSIS — M545 Low back pain, unspecified: Secondary | ICD-10-CM | POA: Diagnosis present

## 2021-02-24 ENCOUNTER — Encounter: Payer: Self-pay | Admitting: Orthopaedic Surgery

## 2021-02-24 ENCOUNTER — Other Ambulatory Visit: Payer: Self-pay

## 2021-02-24 ENCOUNTER — Ambulatory Visit: Payer: Medicaid Other | Admitting: Orthopaedic Surgery

## 2021-02-24 DIAGNOSIS — M545 Low back pain, unspecified: Secondary | ICD-10-CM

## 2021-02-24 MED ORDER — HYDROCODONE-ACETAMINOPHEN 7.5-325 MG PO TABS: 1.0000 | ORAL_TABLET | ORAL | 0 refills | Status: AC | PRN

## 2021-02-24 NOTE — Progress Notes (Signed)
My back is worse.  She has more left sided back pain and paresthesias.  She is getting worse.  She had MRI done which showed: IMPRESSION: Lumbar spondylosis, as outlined and with findings most notably as follows.   At L5-S1, there is mild disc degeneration. Disc bulge with mild endplate spurring. Superimposed broad-based left center/foraminal disc protrusion. Mild facet arthrosis. The disc protrusion contributes to left subarticular stenosis, contacting and posteriorly displacing the descending left S1 nerve root. It also contributes to moderate left neural foraminal narrowing.   At L4-L5, there is mild disc degeneration. Disc bulge. Superimposed broad-based center/left subarticular disc protrusion. Mild facet arthrosis/ligamentum flavum hypertrophy. The disc protrusion contributes to moderate left subarticular stenosis, contacting and crowding the descending left L5 nerve root. The disc protrusion also mildly narrows the central canal. Mild bilateral neural foraminal narrowing.   I have explained the findings to her.  I will have her see a neurosurgeon.  I have independently reviewed the MRI.      Spine/Pelvis examination:  Inspection:  Overall, sacoiliac joint benign and hips nontender; without crepitus or defects.   Thoracic spine inspection: Alignment normal without kyphosis present   Lumbar spine inspection:  Alignment  with normal lumbar lordosis, without scoliosis apparent.   Thoracic spine palpation:  without tenderness of spinal processes   Lumbar spine palpation: with tenderness of lumbar area; with tightness of lumbar muscles    Range of Motion:   Lumbar flexion, forward flexion is 35 with pain or tenderness    Lumbar extension is 5 with pain or tenderness   Left lateral bend is Abnormal- 10   with pain or tenderness   Right lateral bend is Normal without pain or tenderness   Straight leg raising is Abnormal- 30 degrees   Strength & tone: Normal   Stability  overall normal stability     Encounter Diagnosis  Name Primary?   Lumbar back pain Yes   I will have her see neurosurgeon.  I will increase strength of pain medicine.  I have reviewed the [place] Controlled Substance Reporting System web site prior to prescribing narcotic medicine for this patient.  Call if any problem.  Precautions discussed.  Electronically Signed Darreld Mclean, MD 1/5/202310:57 AM

## 2021-02-24 NOTE — Patient Instructions (Signed)
[facility]  ADDRESS [address] [place], [place] [address]  PHONE [phone]

## 2021-03-03 ENCOUNTER — Telehealth: Payer: Self-pay | Admitting: Orthopaedic Surgery

## 2021-03-03 MED ORDER — HYDROCODONE-ACETAMINOPHEN 7.5-325 MG PO TABS: 1.0000 | ORAL_TABLET | Freq: Four times a day (QID) | ORAL | 0 refills | Status: AC | PRN

## 2021-03-03 NOTE — Telephone Encounter (Signed)
Patient wants refill on her pain medicine, also patient states she has not heard anything from the neurologist office.   HYDROcodone-acetaminophen (NORCO) 7.5-325 MG tablet   Pharmacy:  [facility] on [address]

## 2021-03-30 ENCOUNTER — Encounter: Payer: Self-pay | Admitting: Radiology

## 2021-05-09 ENCOUNTER — Other Ambulatory Visit: Payer: Self-pay

## 2021-05-09 ENCOUNTER — Ambulatory Visit
Admission: EM | Admit: 2021-05-09 | Discharge: 2021-05-09 | Disposition: A | Discharge status: Home / Self Care | Payer: Medicaid Other | Attending: Student | Admitting: Student

## 2021-05-09 ENCOUNTER — Ambulatory Visit (INDEPENDENT_AMBULATORY_CARE_PROVIDER_SITE_OTHER): Payer: Medicaid Other

## 2021-05-09 DIAGNOSIS — J208 Acute bronchitis due to other specified organisms: Secondary | ICD-10-CM | POA: Diagnosis not present

## 2021-05-09 DIAGNOSIS — R059 Cough, unspecified: Secondary | ICD-10-CM | POA: Diagnosis not present

## 2021-05-09 DIAGNOSIS — F17200 Nicotine dependence, unspecified, uncomplicated: Secondary | ICD-10-CM

## 2021-05-09 MED ORDER — BENZONATATE 100 MG PO CAPS: 100.0000 mg | ORAL_CAPSULE | Freq: Three times a day (TID) | ORAL | 0 refills | Status: DC

## 2021-05-09 MED ORDER — ALBUTEROL SULFATE HFA 108 (90 BASE) MCG/ACT IN AERS: 1.0000 | INHALATION_SPRAY | Freq: Four times a day (QID) | RESPIRATORY_TRACT | 0 refills | Status: AC | PRN

## 2021-05-09 MED ORDER — PREDNISONE 10 MG (21) PO TBPK: ORAL_TABLET | Freq: Every day | ORAL | 0 refills | Status: DC

## 2021-05-09 NOTE — Discharge Instructions (Addendum)
-  Prednisone taper for cough/bronchitis. I recommend taking this in the morning as it could give you energy.  Avoid NSAIDs like ibuprofen and alleve while taking this medication as they can increase your risk of stomach upset and even GI bleeding when in combination with a steroid. You can continue tylenol (acetaminophen) up to 1000mg  3x daily. ?-Albuterol inhaler as needed for cough, wheezing, shortness of breath, 1 to 2 puffs every 6 hours as needed. ?-Tessalon (Benzonatate) as needed for cough. Take one pill up to 3x daily (every 8 hours) ?-It can take 1-2 weeks to feel 100% better following bronchitis. Follow-up if symptoms getting worse instead of better - coughing up dark or bloody mucous, shortness of breath, chest pain at rest, new fevers, etc ? ?

## 2021-05-09 NOTE — ED Triage Notes (Signed)
Pt states she has had this hard cough that makes her vomit and urinate herself for about a week ? ?Pt states she took a home Covid test and it was negative ? ?Denies Fever ? ? ?

## 2021-05-09 NOTE — ED Provider Notes (Signed)
?[place] ? ? ? ?CSN: [id number] ?Arrival date & time: 05/09/21  1631 ? ? ?  ? ?History   ?Chief Complaint ?Chief Complaint  ?Patient presents with  ? Cough  ? ? ?HPI ?[name] is a 46 y.o. female presenting with cough, has been coughing so hard that she vomits and urinates.  History current smoker, no formal diagnosis of pulmonary disease.  States that she has been coughing so hard that she has urinated and vomited.  Cough is nonproductive.  The cough is keeping her up at night.  Some shortness of breath with coughing episodes.  Denies shortness of breath at rest, chest pain at rest, fever/chills, dizziness, weakness.  Has attempted Mucinex with minimal relief. ? ?HPI ? ?Past Medical History:  ?Diagnosis Date  ? Anemia   ? Chronic back pain   ? Hypertension   ? PE (pulmonary embolism) 09/2004  ? while pregnant  ? Sciatica   ? ? ?Patient Active Problem List  ? Diagnosis Date Noted  ? Abscess of tonsil 05/23/2013  ? ? ?Past Surgical History:  ?Procedure Laterality Date  ? CHOLECYSTECTOMY    ? TUBAL LIGATION    ? ? ?OB History   ? ? Gravida  ?9  ? Para  ?7  ? Term  ?7  ? Preterm  ?   ? AB  ?2  ? Living  ?7  ?  ? ? SAB  ?   ? IAB  ?   ? Ectopic  ?   ? Multiple  ?   ? Live Births  ?   ?   ?  ?  ? ? ? ?Home Medications   ? ?Prior to Admission medications   ?Medication Sig Start Date End Date Taking? Authorizing Provider  ?albuterol (VENTOLIN HFA) 108 (90 Base) MCG/ACT inhaler Inhale 1-2 puffs into the lungs every 6 (six) hours as needed for wheezing or shortness of breath. 05/09/21  Yes Hazel Sams, PA-C  ?benzonatate (TESSALON) 100 MG capsule Take 1 capsule (100 mg total) by mouth every 8 (eight) hours. 05/09/21  Yes Hazel Sams, PA-C  ?predniSONE (STERAPRED UNI-PAK 21 TAB) 10 MG (21) TBPK tablet Take by mouth daily. Take 6 tabs by mouth daily  for 2 days, then 5 tabs for 2 days, then 4 tabs for 2 days, then 3 tabs for 2 days, 2 tabs for 2 days, then 1 tab by mouth daily for 2 days 05/09/21  Yes  Hazel Sams, PA-C  ? ? ?Family History ?No family history on file. ? ?Social History ?Social History  ? ?Tobacco Use  ? Smoking status: Every Day  ?  Packs/day: 0.50  ?  Types: Cigarettes  ? Smokeless tobacco: Never  ?Vaping Use  ? Vaping Use: Never used  ?Substance Use Topics  ? Alcohol use: Yes  ? Drug use: Not Currently  ? ? ? ?Allergies   ?Penicillins ? ? ?Review of Systems ?Review of Systems  ?Constitutional:  Negative for appetite change, chills and fever.  ?HENT:  Positive for congestion. Negative for ear pain, rhinorrhea, sinus pressure, sinus pain and sore throat.   ?Eyes:  Negative for redness and visual disturbance.  ?Respiratory:  Positive for cough. Negative for chest tightness, shortness of breath and wheezing.   ?Cardiovascular:  Negative for chest pain and palpitations.  ?Gastrointestinal:  Negative for abdominal pain, constipation, diarrhea, nausea and vomiting.  ?Genitourinary:  Negative for dysuria, frequency and urgency.  ?Musculoskeletal:  Negative for myalgias.  ?  Neurological:  Negative for dizziness, weakness and headaches.  ?Psychiatric/Behavioral:  Negative for confusion.   ?All other systems reviewed and are negative. ? ? ?Physical Exam ?Triage Vital Signs ?ED Triage Vitals  ?Enc Vitals Group  ?   BP 05/09/21 1831 129/72  ?   Pulse Rate 05/09/21 1831 71  ?   Resp 05/09/21 1831 18  ?   Temp 05/09/21 1831 (!) 97.5 ?F (36.4 ?C)  ?   Temp Source 05/09/21 1831 Tympanic  ?   SpO2 05/09/21 1831 100 %  ?   Weight --   ?   Height --   ?   Head Circumference --   ?   Peak Flow --   ?   Pain Score 05/09/21 1828 10  ?   Pain Loc --   ?   Pain Edu? --   ?   Excl. in [place]? --   ? ?No data found. ? ?Updated Vital Signs ?BP 129/72 (BP Location: Right Arm)   Pulse 71   Temp (!) 97.5 ?F (36.4 ?C) (Tympanic)   Resp 18   LMP 05/05/2021 (Exact Date)   SpO2 100%  ? ?Visual Acuity ?Right Eye Distance:   ?Left Eye Distance:   ?Bilateral Distance:   ? ?Right Eye Near:   ?Left Eye Near:    ?Bilateral Near:     ? ?Physical Exam ?Vitals reviewed.  ?Constitutional:   ?   General: She is not in acute distress. ?   Appearance: Normal appearance. She is not ill-appearing.  ?HENT:  ?   Head: Normocephalic and atraumatic.  ?   Right Ear: Tympanic membrane, ear canal and external ear normal. No tenderness. No middle ear effusion. There is no impacted cerumen. Tympanic membrane is not perforated, erythematous, retracted or bulging.  ?   Left Ear: Tympanic membrane, ear canal and external ear normal. No tenderness.  No middle ear effusion. There is no impacted cerumen. Tympanic membrane is not perforated, erythematous, retracted or bulging.  ?   Nose: Nose normal. No congestion.  ?   Mouth/Throat:  ?   Mouth: Mucous membranes are moist.  ?   Pharynx: Uvula midline. No oropharyngeal exudate or posterior oropharyngeal erythema.  ?Eyes:  ?   Extraocular Movements: Extraocular movements intact.  ?   Pupils: Pupils are equal, round, and reactive to light.  ?Cardiovascular:  ?   Rate and Rhythm: Normal rate and regular rhythm.  ?   Heart sounds: Normal heart sounds.  ?Pulmonary:  ?   Effort: Pulmonary effort is normal.  ?   Breath sounds: Rhonchi present. No decreased breath sounds, wheezing or rales.  ?   Comments: Freq hacking cough  ?Rhonchi throughout  ?Abdominal:  ?   Palpations: Abdomen is soft.  ?   Tenderness: There is no abdominal tenderness. There is no guarding or rebound.  ?Lymphadenopathy:  ?   Cervical: No cervical adenopathy.  ?   Right cervical: No superficial cervical adenopathy. ?   Left cervical: No superficial cervical adenopathy.  ?Neurological:  ?   General: No focal deficit present.  ?   Mental Status: She is alert and oriented to person, place, and time.  ?Psychiatric:     ?   Mood and Affect: Mood normal.     ?   Behavior: Behavior normal.     ?   Thought Content: Thought content normal.     ?   Judgment: Judgment normal.  ? ? ? ?UC Treatments / Results  ?  Labs ?(all labs ordered are listed, but only abnormal  results are displayed) ?Labs Reviewed - No data to display ? ?EKG ? ? ?Radiology ?DG Chest 2 View ? ?Result Date: 05/09/2021 ?CLINICAL DATA:  Cough EXAM: CHEST - 2 VIEW COMPARISON:  09/17/2020 FINDINGS: The heart size and mediastinal contours are within normal limits. No focal airspace consolidation, pleural effusion, or pneumothorax. The visualized skeletal structures are unremarkable. IMPRESSION: No active cardiopulmonary disease. Electronically Signed   By: Davina Poke D.O.   On: 05/09/2021 19:08   ? ?Procedures ?Procedures (including critical care time) ? ?Medications Ordered in UC ?Medications - No data to display ? ?Initial Impression / Assessment and Plan / UC Course  ?I have reviewed the triage vital signs and the nursing notes. ? ?Pertinent labs & imaging results that were available during my care of the patient were reviewed by me and considered in my medical decision making (see chart for details). ? ?  ? ?This patient is a very pleasant 46 y.o. year old female presenting with viral bronchitis. Afebrile, nontachy. Rhonchi throughout. Current smoker. LMP 02-21-1976, States she is not pregnant or breastfeeding. ? ?CXR - negative.  ? ?She is a smoker, but no formal diagnosis of pulmonary disease.  Cough is nonproductive, and she does not meet criterion for COPD exacerbation.  Will manage with prednisone taper, Tessalon, albuterol. ? ?ED return precautions discussed. Patient verbalizes understanding and agreement.  ? ? ?Final Clinical Impressions(s) / UC Diagnoses  ? ?Final diagnoses:  ?Viral bronchitis  ?Current smoker  ? ? ? ?Discharge Instructions   ? ?  ?-Prednisone taper for cough/bronchitis. I recommend taking this in the morning as it could give you energy.  Avoid NSAIDs like ibuprofen and alleve while taking this medication as they can increase your risk of stomach upset and even GI bleeding when in combination with a steroid. You can continue tylenol (acetaminophen) up to 1000mg  3x  daily. ?-Albuterol inhaler as needed for cough, wheezing, shortness of breath, 1 to 2 puffs every 6 hours as needed. ?-Tessalon (Benzonatate) as needed for cough. Take one pill up to 3x daily (every 8 hours) ?-It can take 1-2

## 2021-12-28 ENCOUNTER — Ambulatory Visit: Payer: Medicaid Other | Admitting: Orthopaedic Surgery

## 2022-01-15 IMAGING — CT CT ANGIO HEAD
2 of 12 series · 6 of 33 positions shown · IV contrast (omnipaque)
Comparison: None.

CLINICAL DATA: Headache and nausea

EXAM:
CT ANGIOGRAPHY HEAD AND NECK
TECHNIQUE: Multidetector CT imaging of the head and neck was performed using
the standard protocol during bolus administration of intravenous
contrast. Multiplanar CT image reconstructions and MIPs were
obtained to evaluate the vascular anatomy. Carotid stenosis
measurements (when applicable) are obtained utilizing NASCET
criteria, using the distal internal carotid diameter as the
denominator.
CONTRAST:  100mL OMNIPAQUE IOHEXOL 350 MG/ML SOLN

[Series 9: cta head & neck · axial · 0.39mm/px · z∈[+1101,+1298]mm · 4 of 658 slices shown]
[im 132/658  soft-tissue]
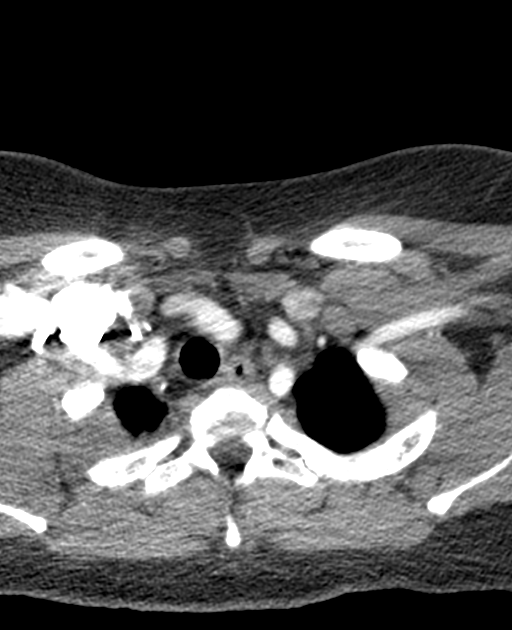
[im 263/658  bone]
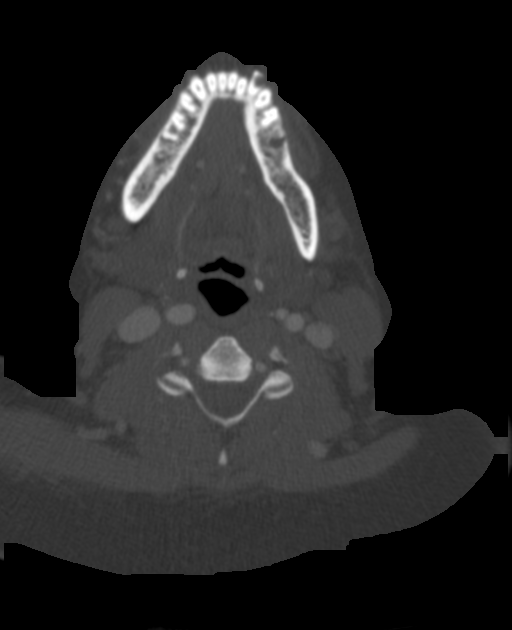
[im 395/658  soft-tissue]
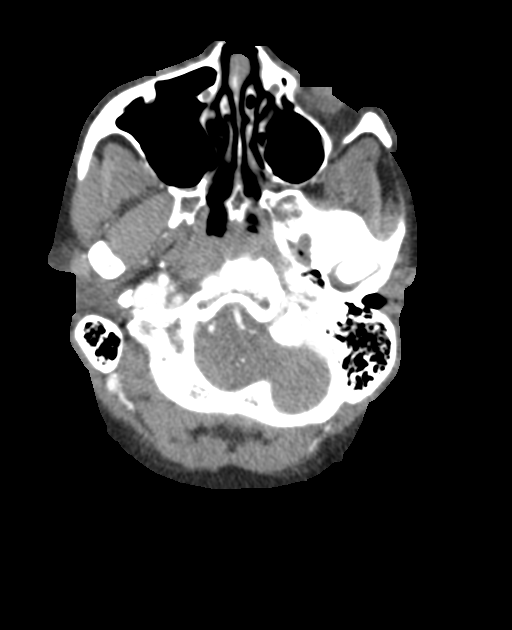
[im 526/658  bone]
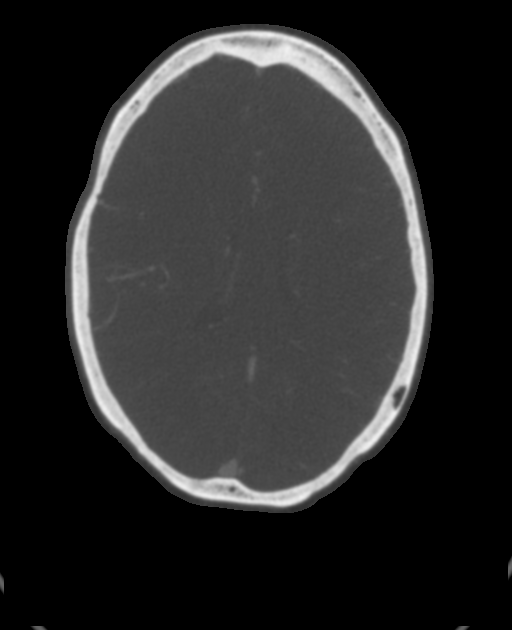

[Series 10: ax thins · axial · 0.39mm/px · z∈[+1145,+1254]mm · 2 of 329 slices shown]
[im 110/329  soft-tissue]
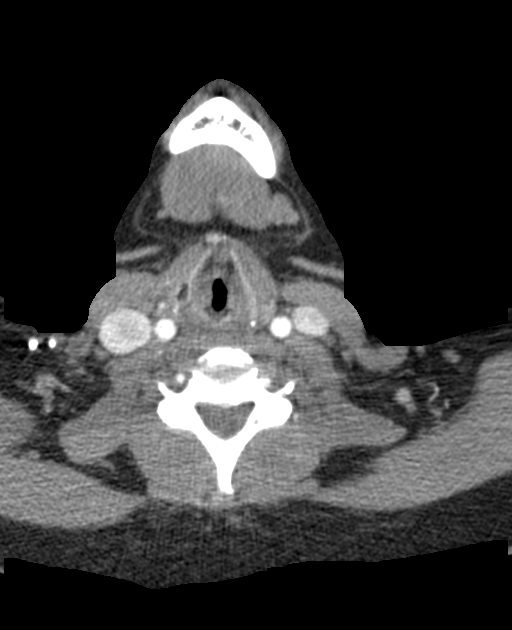
[im 219/329  soft-tissue]
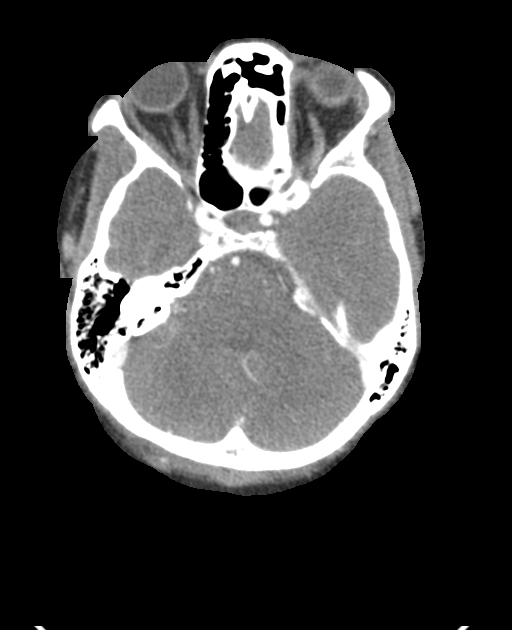

[6 of 33 positions shown; findings below may reference images not displayed]

FINDINGS: CT HEAD FINDINGS

Brain: There is no mass, hemorrhage or extra-axial collection. The
size and configuration of the ventricles and extra-axial CSF spaces
are normal. There is no acute or chronic infarction. The brain
parenchyma is normal.

Skull: The visualized skull base, calvarium and extracranial soft
tissues are normal.

Sinuses/Orbits: No fluid levels or advanced mucosal thickening of
the visualized paranasal sinuses. No mastoid or middle ear effusion.
The orbits are normal.

CTA NECK FINDINGS

SKELETON: There is no bony spinal canal stenosis. No lytic or
blastic lesion.

OTHER NECK: Normal pharynx, larynx and major salivary glands. No
cervical lymphadenopathy. Unremarkable thyroid gland.

UPPER CHEST: No pneumothorax or pleural effusion. No nodules or
masses.

AORTIC ARCH:

There is no calcific atherosclerosis of the aortic arch. There is no
aneurysm, dissection or hemodynamically significant stenosis of the
visualized portion of the aorta. Normal variant aortic arch
branching pattern with the brachiocephalic and left common carotid
arteries sharing a common origin. The visualized proximal subclavian
arteries are widely patent.

RIGHT CAROTID SYSTEM: Normal without aneurysm, dissection or
stenosis.

LEFT CAROTID SYSTEM: Normal without aneurysm, dissection or
stenosis.

VERTEBRAL ARTERIES: Left dominant configuration. Both origins are
clearly patent. There is no dissection, occlusion or flow-limiting
stenosis to the skull base (V1-V3 segments).

CTA HEAD FINDINGS

POSTERIOR CIRCULATION:

--Vertebral arteries: Normal V4 segments.

--Inferior cerebellar arteries: Normal.

--Basilar artery: Normal.

--Superior cerebellar arteries: Normal.

--Posterior cerebral arteries (PCA): Normal.

ANTERIOR CIRCULATION:

--Intracranial internal carotid arteries: Normal.

--Anterior cerebral arteries (ACA): Normal. Both A1 segments are
present. Patent anterior communicating artery (a-comm).

--Middle cerebral arteries (MCA): Normal.

VENOUS SINUSES: As permitted by contrast timing, patent.

ANATOMIC VARIANTS: None

Review of the MIP images confirms the above findings.
IMPRESSION: Normal CTA of the head and neck.

## 2022-02-01 ENCOUNTER — Ambulatory Visit (INDEPENDENT_AMBULATORY_CARE_PROVIDER_SITE_OTHER): Payer: Self-pay | Admitting: Orthopaedic Surgery

## 2022-02-01 ENCOUNTER — Encounter: Payer: Self-pay | Admitting: Orthopaedic Surgery

## 2022-02-01 VITALS — BP 130/96 | HR 98 | Ht 63.0 in | Wt 184.0 lb

## 2022-02-01 DIAGNOSIS — M5442 Lumbago with sciatica, left side: Secondary | ICD-10-CM

## 2022-02-01 DIAGNOSIS — M545 Low back pain, unspecified: Secondary | ICD-10-CM

## 2022-02-01 DIAGNOSIS — W19XXXA Unspecified fall, initial encounter: Secondary | ICD-10-CM

## 2022-02-01 DIAGNOSIS — G8929 Other chronic pain: Secondary | ICD-10-CM

## 2022-02-01 MED ORDER — HYDROCODONE-ACETAMINOPHEN 7.5-325 MG PO TABS: 1.0000 | ORAL_TABLET | ORAL | 0 refills | Status: AC | PRN

## 2022-02-01 MED ORDER — PREDNISONE 5 MG (21) PO TBPK: ORAL_TABLET | ORAL | 0 refills | Status: DC

## 2022-02-01 NOTE — Progress Notes (Signed)
My back is much worse.  I saw her in January and she had MRI of lower back for constant pain.  The MRI showed: IMPRESSION: Lumbar spondylosis, as outlined and with findings most notably as follows.   At L5-S1, there is mild disc degeneration. Disc bulge with mild endplate spurring. Superimposed broad-based left center/foraminal disc protrusion. Mild facet arthrosis. The disc protrusion contributes to left subarticular stenosis, contacting and posteriorly displacing the descending left S1 nerve root. It also contributes to moderate left neural foraminal narrowing.   At L4-L5, there is mild disc degeneration. Disc bulge. Superimposed broad-based center/left subarticular disc protrusion. Mild facet arthrosis/ligamentum flavum hypertrophy. The disc protrusion contributes to moderate left subarticular stenosis, contacting and crowding the descending left L5 nerve root. The disc protrusion also mildly narrows the central canal. Mild bilateral neural foraminal narrowing.  I recommended she see a neurosurgeon.  She was to go but then she felt better and cancelled.    Her pain has come back and is severe at times.  She had a fall on 11-25 and her back got worse.  She is having constant pain again.  She has pain sleeping at night.  She has pain walking distances.    I will set her up to see neurosurgeon again.  I will give pain medicine.  I will give prednisone dose pack.  I have reviewed the [place] Controlled Substance Reporting System web site prior to prescribing narcotic medicine for this patient.  Exam shows marked pain of the lower back, limited motion, NV Intact, muscle tone and strength normal but pain to the left side.  Encounter Diagnoses  Name Primary?   Fall, initial encounter Yes   Chronic midline low back pain with left-sided sciatica    Lumbar back pain    To neurosurgery.  Call if any problem.  Precautions discussed.  Electronically Signed Darreld Mclean,  MD 12/13/20239:53 AM

## 2022-02-01 NOTE — Patient Instructions (Addendum)
You have been referred to [facility] on [address] in [place]. They will call you to schedule the appointment. If you have not heard anything in one week call them to schedule [phone]. This referral has to be uploaded to another system because they are not on the same medical records system as we are. Please allow [place] two business days to get this uploaded for you. If you haven't heard from them by Friday call them and tell them you want to schedule your new patient appointment.   Dr.Keeling is here all day on Tuesdays, Wednesday mornings, and Thursday mornings. If you need anything such as a medication refill, please either call BEFORE the end of the day on [facility] or send a message through [place]. Your pharmacy can send a refill request for you. Calling by the end of the day on [facility] allows [place] time to send Dr.Keeling the request and for him to respond before he leaves on Thursdays.  If Dr. Hilda Lias is out of the office, we may send it to one of the other providers and they may not refill it for the same amount that your original prescription is for.   My name is Abby and I assist Dr.Keeling. If you need anything before your next appointment, please do not hesitate to call the office at [phone] and ask to leave a message for me. I will respond within 24-48 business hours.

## 2022-02-09 ENCOUNTER — Telehealth: Payer: Self-pay | Admitting: Orthopaedic Surgery

## 2022-02-09 ENCOUNTER — Other Ambulatory Visit: Payer: Self-pay

## 2022-02-09 ENCOUNTER — Encounter: Payer: Self-pay | Admitting: Orthopaedic Surgery

## 2022-02-09 MED ORDER — HYDROCODONE-ACETAMINOPHEN 5-325 MG PO TABS: ORAL_TABLET | ORAL | 0 refills | Status: DC

## 2022-02-09 NOTE — Telephone Encounter (Signed)
Patient lvm requesting a refill for Hydrocodone 5-325 to be sent to [facility] on [facility] [address].  Patient's # [phone]

## 2022-02-10 NOTE — Telephone Encounter (Signed)
Patient called back, stating that she still hasn't gotten what she needs.  She is saying that the pharmacy is saying they are waiting on Dr. Hilda Reed.  [facility] on [address].  Pt's # [phone]

## 2022-02-14 NOTE — Telephone Encounter (Signed)
Prescription refilled 02/09/22

## 2022-02-15 ENCOUNTER — Encounter: Payer: Self-pay | Admitting: Radiology

## 2022-02-21 ENCOUNTER — Ambulatory Visit: Payer: Medicaid Other | Admitting: Orthopaedic Surgery

## 2022-02-21 ENCOUNTER — Encounter: Payer: Self-pay | Admitting: Orthopaedic Surgery

## 2022-02-21 DIAGNOSIS — M5442 Lumbago with sciatica, left side: Secondary | ICD-10-CM | POA: Diagnosis not present

## 2022-02-21 DIAGNOSIS — G8929 Other chronic pain: Secondary | ICD-10-CM

## 2022-02-21 MED ORDER — OXYCODONE-ACETAMINOPHEN 7.5-325 MG PO TABS: 1.0000 | ORAL_TABLET | ORAL | 0 refills | Status: DC | PRN

## 2022-02-21 NOTE — Patient Instructions (Addendum)
Keep your appt with [facility].   Allow your pharmacy a few hours to fill your medication  You have been referred to [facility] on [address] in [place]. They will call you to schedule the appointment. If you have not heard anything in one week call them to schedule [phone].   My name is Abby and I assist [place]. If you need anything before your next appointment, please do not hesitate to call the office at [phone] and ask to leave a message for me. I will respond within 24-48 business hours.   Send me a mychart message if you have any issues with your pain medication prescription. I will call the pharmacy and see what the issue is.   Send me a message Wednesday night if your pain medicine is getting low so I can send him the request first thing Thursday morning.

## 2022-02-21 NOTE — Progress Notes (Addendum)
I am much worse.  She has much more lower back pain.  The pain medicine is not helping.  She has no new trauma, no weakness.  Her appointment to neurosurgery is January 11th.  She is in pain, lower back is tender more on the left with positive SLR left 25 degrees, no weakness.  Gait slow but steady.    Encounter Diagnosis  Name Primary?   Chronic midline low back pain with left-sided sciatica Yes   I will increase pain medicine.  I have asked her to call neurosurgery and see if they have any cancellations.  Call if any problem.  Precautions discussed.    Return in two weeks.  Electronically Signed Sanjuana Kava, MD 1/2/202410:12 AM  Insurance will not pay for oxycodone 7.5 unless it is specifically written for Endocet 7.5.  I have done so even though it is the same medicine.  Electronically Franquez, MD 1/19/20246:24 PM

## 2022-02-24 ENCOUNTER — Telehealth: Payer: Self-pay

## 2022-02-27 MED ORDER — OXYCODONE-ACETAMINOPHEN 7.5-325 MG PO TABS: 1.0000 | ORAL_TABLET | ORAL | 0 refills | Status: DC | PRN

## 2022-02-28 NOTE — Telephone Encounter (Signed)
sent 

## 2022-03-01 ENCOUNTER — Ambulatory Visit: Payer: Self-pay | Admitting: Orthopaedic Surgery

## 2022-03-02 ENCOUNTER — Ambulatory Visit: Payer: Self-pay | Admitting: Orthopaedic Surgery

## 2022-03-03 ENCOUNTER — Telehealth: Payer: Self-pay | Admitting: Orthopaedic Surgery

## 2022-03-03 MED ORDER — OXYCODONE-ACETAMINOPHEN 7.5-325 MG PO TABS: 1.0000 | ORAL_TABLET | ORAL | 0 refills | Status: AC | PRN

## 2022-03-07 ENCOUNTER — Ambulatory Visit: Payer: Medicaid Other | Admitting: Orthopaedic Surgery

## 2022-03-09 MED ORDER — HYDROCODONE-ACETAMINOPHEN 5-325 MG PO TABS: ORAL_TABLET | ORAL | 0 refills | Status: DC

## 2022-03-10 ENCOUNTER — Other Ambulatory Visit: Payer: Self-pay

## 2022-03-10 DIAGNOSIS — M545 Low back pain, unspecified: Secondary | ICD-10-CM

## 2022-03-10 DIAGNOSIS — G8929 Other chronic pain: Secondary | ICD-10-CM

## 2022-03-10 MED ORDER — OXYCODONE-ACETAMINOPHEN 7.5-325 MG PO TABS: 1.0000 | ORAL_TABLET | ORAL | 0 refills | Status: DC | PRN

## 2022-03-10 NOTE — Addendum Note (Signed)
Addended by: Willette Pa on: 03/10/2022 06:24 PM   Modules accepted: Orders

## 2022-03-11 NOTE — Progress Notes (Signed)
Sent patient a message advising her that a new prescription for Endocet has been sent in for her.

## 2022-03-14 ENCOUNTER — Ambulatory Visit: Payer: Medicaid Other | Admitting: Orthopaedic Surgery

## 2022-03-14 ENCOUNTER — Encounter: Payer: Self-pay | Admitting: Orthopaedic Surgery

## 2022-03-14 VITALS — BP 158/88 | HR 72 | Ht 63.0 in | Wt 184.0 lb

## 2022-03-14 DIAGNOSIS — G8929 Other chronic pain: Secondary | ICD-10-CM | POA: Diagnosis not present

## 2022-03-14 DIAGNOSIS — M5442 Lumbago with sciatica, left side: Secondary | ICD-10-CM | POA: Diagnosis not present

## 2022-03-14 DIAGNOSIS — M545 Low back pain, unspecified: Secondary | ICD-10-CM

## 2022-03-14 MED ORDER — OXYCODONE-ACETAMINOPHEN 7.5-325 MG PO TABS: 1.0000 | ORAL_TABLET | ORAL | 0 refills | Status: DC | PRN

## 2022-03-14 NOTE — Progress Notes (Signed)
My back is still hurting.  She went to the neurosurgeon appointment but their computers were down and they rescheduled her for an appointment on January 30th.  She still has lower back pain.  She is no better.  She has no weakness.  Lower back is painful, limited ROM, NV intact, no weakness, muscle tone and strength normal.  Encounter Diagnoses  Name Primary?   Chronic midline low back pain with left-sided sciatica Yes   Lumbar back pain    I will call in pain medicine.  Return here in two weeks.  Keep neurosurgical appointment.  Call if any problem.  Precautions discussed.  Electronically Signed Sanjuana Kava, MD 1/23/20248:40 AM

## 2022-03-14 NOTE — Patient Instructions (Signed)
KEEP YOUR APPOINTMENT WITH THE NEUROSURGEON NEXT WEEK.   YOUR PAIN MEDICATION IS BEING REFILLED. TRY TO TAKE IT SPARINGLY AS POSSIBLE. IF YOU HAVE TO HAVE SURGERY, IT IS VERY HARD TO GET AHEAD OF THE PAIN IF YOU ARE ALREADY OWN CHRONIC PAIN MEDICATION  ROV 2 WEEKS

## 2022-03-18 ENCOUNTER — Telehealth: Payer: Self-pay

## 2022-03-18 MED ORDER — OXYCODONE-ACETAMINOPHEN 7.5-325 MG PO TABS: 1.0000 | ORAL_TABLET | ORAL | 0 refills | Status: DC | PRN

## 2022-03-18 NOTE — Telephone Encounter (Signed)
FILLED BY PROVIDER

## 2022-03-24 ENCOUNTER — Other Ambulatory Visit: Payer: Self-pay

## 2022-03-26 ENCOUNTER — Telehealth: Payer: Self-pay | Admitting: Orthopaedic Surgery

## 2022-03-27 MED ORDER — OXYCODONE-ACETAMINOPHEN 7.5-325 MG PO TABS: 1.0000 | ORAL_TABLET | ORAL | 0 refills | Status: DC | PRN

## 2022-03-28 ENCOUNTER — Ambulatory Visit: Payer: Medicaid Other | Admitting: Orthopaedic Surgery

## 2022-03-28 ENCOUNTER — Encounter: Payer: Self-pay | Admitting: Orthopaedic Surgery

## 2022-03-28 VITALS — BP 138/96 | HR 83

## 2022-03-28 DIAGNOSIS — M5442 Lumbago with sciatica, left side: Secondary | ICD-10-CM | POA: Diagnosis not present

## 2022-03-28 DIAGNOSIS — G8929 Other chronic pain: Secondary | ICD-10-CM

## 2022-03-28 DIAGNOSIS — M545 Low back pain, unspecified: Secondary | ICD-10-CM

## 2022-03-28 NOTE — Progress Notes (Signed)
I still hurt bad.  She made the neurosurgery appointment and was seen this time.  Previously, she went to the office and they said their computers were "down".  They did not see her that day and and rescheduled her and then rescheduled her again.  I have reviewed the notes from neurosurgery.  They note she has disc displacing nerve root at L5 and S1.  They have recommended epidural first but told her to wait seven business days before scheduling and then she would have about a ten day to two week wait.  She asked about surgery and they told her she was on pain medicine and that would have to stop first.  She was not pleased with her visit.  I will set up appointment to different neurosurgery physician.  The patient has lost confidence in the other office.  She still has lower back pain with left sided sciatica and positive straight leg raising at 25 degrees on left.  NV intact.  Encounter Diagnoses  Name Primary?   Chronic midline low back pain with left-sided sciatica Yes   Lumbar back pain    To see another neurosurgery to get proper patient rapport and confidence.  I would appreciate their evaluation.  I refilled her pain medicine yesterday.  Return in two weeks.  Call if any problem.  Precautions discussed.  Electronically Signed Sanjuana Kava, MD 2/6/20248:53 AM

## 2022-03-28 NOTE — Patient Instructions (Addendum)
Follow up 2 wks  You've been referred to the neurosurgeon in [place].   You have been referred to see  Dr. Meade Maw [address], [place], [place] [address] Phone: [phone] To discuss surgery options for your back.  If you have any issues, send me a [employer].

## 2022-04-02 ENCOUNTER — Telehealth: Payer: Self-pay | Admitting: Orthopaedic Surgery

## 2022-04-03 MED ORDER — OXYCODONE-ACETAMINOPHEN 7.5-325 MG PO TABS: 1.0000 | ORAL_TABLET | ORAL | 0 refills | Status: DC | PRN

## 2022-04-10 ENCOUNTER — Telehealth: Payer: Self-pay | Admitting: Orthopaedic Surgery

## 2022-04-10 MED ORDER — OXYCODONE-ACETAMINOPHEN 7.5-325 MG PO TABS: 1.0000 | ORAL_TABLET | ORAL | 0 refills | Status: DC | PRN

## 2022-04-11 ENCOUNTER — Encounter: Payer: Self-pay | Admitting: Orthopaedic Surgery

## 2022-04-11 ENCOUNTER — Ambulatory Visit: Payer: Medicaid Other | Admitting: Orthopaedic Surgery

## 2022-04-11 VITALS — BP 132/92 | HR 76 | Ht 63.0 in | Wt 184.0 lb

## 2022-04-11 DIAGNOSIS — M5442 Lumbago with sciatica, left side: Secondary | ICD-10-CM | POA: Diagnosis not present

## 2022-04-11 DIAGNOSIS — G8929 Other chronic pain: Secondary | ICD-10-CM

## 2022-04-11 NOTE — Progress Notes (Signed)
I still hurt bad.  She has appointment to see the neurosurgeon on March 12.   She still has lower back pain that is not getting better. She is taking her pain medicine.  Lower back is tender, no spasm, limited ROM, weakly positive SLR on left, NV intact.  Encounter Diagnosis  Name Primary?   Chronic midline low back pain with left-sided sciatica Yes   Keep neurosurgery appointment.  Call as needed for pain medicine.  Return in one month.  Call if any problem.  Precautions discussed.  Electronically Signed Sanjuana Kava, MD 2/20/202410:07 AM

## 2022-04-13 ENCOUNTER — Telehealth: Payer: Self-pay | Admitting: Orthopaedic Surgery

## 2022-04-14 MED ORDER — OXYCODONE-ACETAMINOPHEN 7.5-325 MG PO TABS: 1.0000 | ORAL_TABLET | ORAL | 0 refills | Status: DC | PRN

## 2022-04-19 ENCOUNTER — Telehealth: Payer: Self-pay | Admitting: Orthopaedic Surgery

## 2022-04-19 MED ORDER — OXYCODONE-ACETAMINOPHEN 7.5-325 MG PO TABS: 1.0000 | ORAL_TABLET | ORAL | 0 refills | Status: DC | PRN

## 2022-04-20 ENCOUNTER — Encounter: Payer: Self-pay | Admitting: Radiology

## 2022-04-22 ENCOUNTER — Other Ambulatory Visit: Payer: Self-pay | Admitting: Orthopaedic Surgery

## 2022-04-23 ENCOUNTER — Telehealth: Payer: Self-pay | Admitting: Orthopaedic Surgery

## 2022-04-24 ENCOUNTER — Other Ambulatory Visit: Payer: Self-pay

## 2022-04-25 MED ORDER — OXYCODONE-ACETAMINOPHEN 7.5-325 MG PO TABS: 1.0000 | ORAL_TABLET | ORAL | 0 refills | Status: DC | PRN

## 2022-04-27 ENCOUNTER — Telehealth: Payer: Self-pay

## 2022-04-27 MED ORDER — OXYCODONE-ACETAMINOPHEN 7.5-325 MG PO TABS: 1.0000 | ORAL_TABLET | ORAL | 0 refills | Status: DC | PRN

## 2022-04-28 NOTE — Progress Notes (Unsigned)
Referring Physician:  Sanjuana Kava, MD [address] [place],  [place] [address]  Primary Physician:  Patient, No Pcp Per  History of Present Illness: 04/28/2022 [name] is here today with a chief complaint of *** lower back pain  Lower back is tender, no spasm, limited ROM, weakly positive SLR on left, NV intact.   Duration: *** March 2020? Location: ***Chronic midline low back pain with left-sided sciatica ,lower back pain with left sided sciatica and positive straight leg raising at 25 degrees on left. Lumbar back pain  Quality: ***constant Severity: ***tingling in the bilateral lower extremities  Precipitating: aggravated by ***pain sleeping at night. She has pain walking distances.  Modifying factors: made better by *** Weakness: none Timing: *** Bowel/Bladder Dysfunction: none  Conservative measures: Oxycodone, Tizanidine Physical therapy: ***  Multimodal medical therapy including regular antiinflammatories: ***  Injections: *** epidural steroid injections   Past Surgery: ***  [name] has ***no symptoms of cervical myelopathy.  The symptoms are causing a significant impact on the patient's life.   I have utilized the care everywhere function in epic to review the outside records available from external health systems.  Review of Systems:  A 10 point review of systems is negative, except for the pertinent positives and negatives detailed in the HPI.  Past Medical History: Past Medical History:  Diagnosis Date   Anemia    Chronic back pain    Hypertension    PE (pulmonary embolism) 09/2004   while pregnant   Sciatica     Past Surgical History: Past Surgical History:  Procedure Laterality Date   CHOLECYSTECTOMY     TUBAL LIGATION      Allergies: Allergies as of 05/02/2022 - Review Complete 04/11/2022  Allergen Reaction Noted   Penicillins Hives 08/06/2011    Medications: No outpatient medications have been marked as taking  for the 05/02/22 encounter (Appointment) with Meade Maw, MD.    Social History: Social History   Tobacco Use   Smoking status: Every Day    Packs/day: 0.50    Types: Cigarettes   Smokeless tobacco: Never  Vaping Use   Vaping Use: Never used  Substance Use Topics   Alcohol use: Yes   Drug use: Not Currently    Family Medical History: No family history on file.  Physical Examination: There were no vitals filed for this visit.  General: Patient is well developed, well nourished, calm, collected, and in no apparent distress. Attention to examination is appropriate.  Neck:   Supple.  Full range of motion.  Respiratory: Patient is breathing without any difficulty.   NEUROLOGICAL:     Awake, alert, oriented to person, place, and time.  Speech is clear and fluent.   Cranial Nerves: Pupils equal round and reactive to light.  Facial tone is symmetric.  Facial sensation is symmetric. Shoulder shrug is symmetric. Tongue protrusion is midline.  There is no pronator drift.  ROM of spine: full.    Strength: Side Biceps Triceps Deltoid Interossei Grip Wrist Ext. Wrist Flex.  R '5 5 5 5 5 5 5  '$ L '5 5 5 5 5 5 5   '$ Side Iliopsoas Quads Hamstring PF DF EHL  R '5 5 5 5 5 5  '$ L '5 5 5 5 5 5   '$ Reflexes are ***2+ and symmetric at the biceps, triceps, brachioradialis, patella and achilles.   Hoffman's is absent.   Bilateral upper and lower extremity sensation is intact to light touch.  No evidence of dysmetria noted.  Gait is normal.     Medical Decision Making  Imaging: ***  I have personally reviewed the images and agree with the above interpretation.  Assessment and Plan: [name] is a pleasant 47 y.o. female with ***    Thank you for involving me in the care of this patient.      [name] K. Erika Ribas MD, [facility]

## 2022-05-02 ENCOUNTER — Encounter: Payer: Self-pay | Admitting: Neurosurgery

## 2022-05-02 ENCOUNTER — Ambulatory Visit: Payer: Medicaid Other | Admitting: Neurosurgery

## 2022-05-02 VITALS — BP 150/82 | HR 94 | Ht 64.0 in | Wt 184.0 lb

## 2022-05-02 DIAGNOSIS — Z7689 Persons encountering health services in other specified circumstances: Secondary | ICD-10-CM

## 2022-05-02 DIAGNOSIS — G8929 Other chronic pain: Secondary | ICD-10-CM | POA: Diagnosis not present

## 2022-05-02 DIAGNOSIS — M545 Low back pain, unspecified: Secondary | ICD-10-CM

## 2022-05-03 ENCOUNTER — Other Ambulatory Visit: Payer: Self-pay | Admitting: Orthopaedic Surgery

## 2022-05-03 NOTE — Telephone Encounter (Signed)
DONE

## 2022-05-04 ENCOUNTER — Telehealth: Payer: Self-pay | Admitting: Orthopaedic Surgery

## 2022-05-05 MED ORDER — OXYCODONE-ACETAMINOPHEN 5-325 MG PO TABS: 1.0000 | ORAL_TABLET | Freq: Four times a day (QID) | ORAL | 0 refills | Status: DC | PRN

## 2022-05-10 ENCOUNTER — Encounter: Payer: Self-pay | Admitting: Orthopaedic Surgery

## 2022-05-10 ENCOUNTER — Ambulatory Visit: Payer: Medicaid Other | Admitting: Orthopaedic Surgery

## 2022-05-10 ENCOUNTER — Telehealth: Payer: Self-pay | Admitting: Orthopaedic Surgery

## 2022-05-10 VITALS — BP 153/103 | HR 81

## 2022-05-10 DIAGNOSIS — G8929 Other chronic pain: Secondary | ICD-10-CM | POA: Diagnosis not present

## 2022-05-10 DIAGNOSIS — M5442 Lumbago with sciatica, left side: Secondary | ICD-10-CM

## 2022-05-10 DIAGNOSIS — M545 Low back pain, unspecified: Secondary | ICD-10-CM

## 2022-05-10 MED ORDER — HYDROCODONE-ACETAMINOPHEN 7.5-325 MG PO TABS: 1.0000 | ORAL_TABLET | Freq: Four times a day (QID) | ORAL | 0 refills | Status: AC | PRN

## 2022-05-10 NOTE — Telephone Encounter (Signed)
Spoke with pharmacy/Jessica. Advised her to hold script for 2 days per provider.

## 2022-05-10 NOTE — Telephone Encounter (Signed)
Dr. Brooke Bonito patient - Janett Billow w[facility] on [address] [phone] called and wants to know if the patient is supposed to get the Hydrocodone that was called in this morning because the patient just picked up Oxycodone.

## 2022-05-10 NOTE — Progress Notes (Signed)
My back hurts more.  She saw the neurosurgeon in [place].  I have reviewed the notes.  It was suggested she go to pain management and have epidurals before any consideration for surgery.  Appointment was set up for May 30.  Patient is having pain still, paresthesias and taking her pain medicine.  I will have her seen by Dr. Ernestina Patches for epidurals.  This can happen the week of April 1 and will be quicker for her.  She agrees to this.  She has chronic back pain with left sided paresthesias.  She has no spasm Today.  ROM is good but painful.  She has good motor and normal NV.  Encounter Diagnoses  Name Primary?   Chronic midline low back pain with left-sided sciatica Yes   Lumbar back pain    To see Dr. Ernestina Patches.  I have renewed pain medicine but changed to hydrocodone.  Return in three weeks.  Call if any problem.  Precautions discussed.  Electronically Signed Sanjuana Kava, MD 3/20/20249:16 AM

## 2022-05-10 NOTE — Patient Instructions (Addendum)
You have been referred to Dr.Fred Ernestina Patches at our sister office in [place] to discuss an epidural steroid injection. If you have not received a call from their office to schedule your consultation appointment in 3-5 business days, please call them to set that appointment up.   [place] PHONE: [phone]  **PLEASE NOTE: HIS OFFICE MAY HAVE TO GET APPROVAL FROM YOUR INSURANCE COMPANY FOR THAT FIRST VISIT. THIS MAY TAKE ADDITIONAL TIME AS MANY INSURANCES REQUIRE A REVIEW BEFORE THEY GRANT THE APPROVAL**  YOU WILL NEED A DRIVER AFTER YOUR INJECTION

## 2022-05-15 ENCOUNTER — Encounter: Payer: Self-pay | Admitting: Orthopaedic Surgery

## 2022-05-23 ENCOUNTER — Ambulatory Visit: Payer: Medicaid Other | Admitting: Physical Medicine and Rehabilitation

## 2022-05-30 ENCOUNTER — Ambulatory Visit: Payer: Medicaid Other | Admitting: Internal Medicine

## 2022-06-08 ENCOUNTER — Ambulatory Visit: Payer: Medicaid Other | Admitting: Student in an Organized Health Care Education/Training Program

## 2022-07-03 NOTE — Progress Notes (Deleted)
Referring Physician:  No referring provider defined for this encounter.  Primary Physician:  Patient, No Pcp Per  History of Present Illness: 07/03/2022*** [name] is healthy.***  Last seen by Dr. Myer Haff on 05/02/22 for back pain with numbness in her legs. She has known DDD L4-S1 that could impinge Left L5 and S1 nerve roots.   Dr. Myer Haff wanted her to see pain management to consider injections. He also ordered lumbar flexion/extension xrays.   Dr. Hilda Lias referred her to Dr. Alvester Morin for injections***. He prescribed hydrocodone for her pain as well.   She is here for follow up.      Bowel/Bladder Dysfunction: none   Conservative measures: heating pads Physical therapy:   Multimodal medical therapy including regular antiinflammatories:  Oxycodone, Tizanidine, hydrocodone Injections: denies epidural steroid injections***    Past Surgery: denies   [name] has no symptoms of cervical myelopathy.   The symptoms are causing a significant impact on the patient's life.   Review of Systems:  A 10 point review of systems is negative, except for the pertinent positives and negatives detailed in the HPI.  Past Medical History: Past Medical History:  Diagnosis Date   Anemia    Chronic back pain    Hypertension    PE (pulmonary embolism) 09/2004   while pregnant   Sciatica     Past Surgical History: Past Surgical History:  Procedure Laterality Date   CHOLECYSTECTOMY     TUBAL LIGATION      Allergies: Allergies as of 07/04/2022 - Review Complete 05/10/2022  Allergen Reaction Noted   Penicillins Hives 08/06/2011    Medications: Outpatient Encounter Medications as of 07/04/2022  Medication Sig   albuterol (VENTOLIN HFA) 108 (90 Base) MCG/ACT inhaler Inhale 1-2 puffs into the lungs every 6 (six) hours as needed for wheezing or shortness of breath.   tiZANidine (ZANAFLEX) 4 MG tablet Take 4 mg by mouth every 6 (six) hours as needed.   No  facility-administered encounter medications on file as of 07/04/2022.    Social History: Social History   Tobacco Use   Smoking status: Every Day    Packs/day: .5    Types: Cigarettes   Smokeless tobacco: Never  Vaping Use   Vaping Use: Never used  Substance Use Topics   Alcohol use: Yes   Drug use: Not Currently    Family Medical History: No family history on file.  Physical Examination: There were no vitals filed for this visit.  General: Patient is well developed, well nourished, calm, collected, and in no apparent distress. Attention to examination is appropriate.  Respiratory: Patient is breathing without any difficulty.   NEUROLOGICAL:     Awake, alert, oriented to person, place, and time.  Speech is clear and fluent. Fund of knowledge is appropriate.   Cranial Nerves: Pupils equal round and reactive to light.  Facial tone is symmetric.    *** ROM of cervical spine *** pain *** posterior cervical tenderness. *** tenderness in bilateral trapezial region.   *** ROM of lumbar spine *** pain *** posterior lumbar tenderness.   No abnormal lesions on exposed skin.   Strength: Side Biceps Triceps Deltoid Interossei Grip Wrist Ext. Wrist Flex.  R 5 5 5 5 5 5 5   L 5 5 5 5 5 5 5    Side Iliopsoas Quads Hamstring PF DF EHL  R 5 5 5 5 5 5   L 5 5 5 5 5 5    Reflexes are ***2+ and symmetric  at the biceps, triceps, brachioradialis, patella and achilles.   Hoffman's is absent.  Clonus is not present.   Bilateral upper and lower extremity sensation is intact to light touch.     Gait is normal.   ***No difficulty with tandem gait.    Medical Decision Making  Imaging: Lumbar xrays dated ***: ***  I have personally reviewed the images and agree with the above interpretation.  Assessment and Plan: [name] is a pleasant 47 y.o. female has ***  Treatment options discussed with patient and following plan made:   - Order for physical therapy for *** spine ***.  Patient to call to schedule appointment. *** - Continue current medications including ***. Reviewed dosing and side effects.  - Prescription for ***. Reviewed dosing and side effects. Take with food.  - Prescription for *** to take prn muscle spasms. Reviewed dosing and side effects. Discussed this can cause drowsiness.  - MRI of *** to further evaluate *** radiculopathy. No improvement time or medications (***).  - Referral to PMR at [facility] to discuss possible *** injections.  - Will schedule phone visit to review MRI results once I get them back.   I spent a total of *** minutes in face-to-face and non-face-to-face activities related to this patient's care today including review of outside records, review of imaging, review of symptoms, physical exam, discussion of differential diagnosis, discussion of treatment options, and documentation.   Thank you for involving me in the care of this patient.   Drake Leach PA-C Dept. of Neurosurgery

## 2022-07-04 ENCOUNTER — Ambulatory Visit: Payer: Medicaid Other | Admitting: Orthopedic Surgery

## 2022-10-05 ENCOUNTER — Emergency Department (HOSPITAL_COMMUNITY)
Admission: EM | Admit: 2022-10-05 | Discharge: 2022-10-05 | Disposition: A | Discharge status: Home / Self Care | Payer: Medicaid Other | Attending: Emergency Medicine | Admitting: Emergency Medicine

## 2022-10-05 ENCOUNTER — Encounter (HOSPITAL_COMMUNITY): Payer: Self-pay

## 2022-10-05 ENCOUNTER — Emergency Department (HOSPITAL_COMMUNITY): Payer: Medicaid Other

## 2022-10-05 DIAGNOSIS — K5900 Constipation, unspecified: Secondary | ICD-10-CM | POA: Insufficient documentation

## 2022-10-05 DIAGNOSIS — E876 Hypokalemia: Secondary | ICD-10-CM | POA: Insufficient documentation

## 2022-10-05 DIAGNOSIS — I10 Essential (primary) hypertension: Secondary | ICD-10-CM | POA: Insufficient documentation

## 2022-10-05 DIAGNOSIS — D649 Anemia, unspecified: Secondary | ICD-10-CM | POA: Diagnosis not present

## 2022-10-05 DIAGNOSIS — M545 Low back pain, unspecified: Secondary | ICD-10-CM | POA: Insufficient documentation

## 2022-10-05 DIAGNOSIS — D259 Leiomyoma of uterus, unspecified: Secondary | ICD-10-CM | POA: Insufficient documentation

## 2022-10-05 DIAGNOSIS — D219 Benign neoplasm of connective and other soft tissue, unspecified: Secondary | ICD-10-CM

## 2022-10-05 DIAGNOSIS — F17210 Nicotine dependence, cigarettes, uncomplicated: Secondary | ICD-10-CM | POA: Insufficient documentation

## 2022-10-05 DIAGNOSIS — G8929 Other chronic pain: Secondary | ICD-10-CM | POA: Diagnosis not present

## 2022-10-05 LAB — CBC WITH DIFFERENTIAL/PLATELET
Abs Immature Granulocytes: 0.02 10*3/uL (ref 0.00–0.07)
Basophils Absolute: 0 10*3/uL (ref 0.0–0.1)
Basophils Relative: 0 %
Eosinophils Absolute: 0.1 10*3/uL (ref 0.0–0.5)
Eosinophils Relative: 1 %
HCT: 27.7 % — ABNORMAL LOW (ref 36.0–46.0)
Hemoglobin: 8.1 g/dL — ABNORMAL LOW (ref 12.0–15.0)
Immature Granulocytes: 0 %
Lymphocytes Relative: 39 %
Lymphs Abs: 2.8 10*3/uL (ref 0.7–4.0)
MCH: 22.8 pg — ABNORMAL LOW (ref 26.0–34.0)
MCHC: 29.2 g/dL — ABNORMAL LOW (ref 30.0–36.0)
MCV: 78 fL — ABNORMAL LOW (ref 80.0–100.0)
Monocytes Absolute: 0.3 10*3/uL (ref 0.1–1.0)
Monocytes Relative: 5 %
Neutro Abs: 3.9 10*3/uL (ref 1.7–7.7)
Neutrophils Relative %: 55 %
Platelets: 252 10*3/uL (ref 150–400)
RBC: 3.55 MIL/uL — ABNORMAL LOW (ref 3.87–5.11)
RDW: 18.4 % — ABNORMAL HIGH (ref 11.5–15.5)
WBC: 7.1 10*3/uL (ref 4.0–10.5)
nRBC: 0 % (ref 0.0–0.2)

## 2022-10-05 LAB — COMPREHENSIVE METABOLIC PANEL
ALT: 11 U/L (ref 0–44)
AST: 15 U/L (ref 15–41)
Albumin: 3.6 g/dL (ref 3.5–5.0)
Alkaline Phosphatase: 74 U/L (ref 38–126)
Anion gap: 9 (ref 5–15)
BUN: 10 mg/dL (ref 6–20)
CO2: 26 mmol/L (ref 22–32)
Calcium: 8.7 mg/dL — ABNORMAL LOW (ref 8.9–10.3)
Chloride: 101 mmol/L (ref 98–111)
Creatinine, Ser: 0.73 mg/dL (ref 0.44–1.00)
GFR, Estimated: >60 mL/min (ref >60)
Glucose, Bld: 175 mg/dL — ABNORMAL HIGH (ref 70–99)
Potassium: 3 mmol/L — ABNORMAL LOW (ref 3.5–5.1)
Sodium: 136 mmol/L (ref 135–145)
Total Bilirubin: 0.3 mg/dL (ref 0.3–1.2)
Total Protein: 7.1 g/dL (ref 6.5–8.1)

## 2022-10-05 LAB — LACTIC ACID, PLASMA
Lactic Acid, Venous: 2.1 mmol/L (ref 0.5–1.9)
Lactic Acid, Venous: 1 mmol/L (ref 0.5–1.9)

## 2022-10-05 LAB — MAGNESIUM: Magnesium: 1.7 mg/dL (ref 1.7–2.4)

## 2022-10-05 LAB — HCG, QUANTITATIVE, PREGNANCY: hCG, Beta Chain, Quant, S: <1 m[IU]/mL (ref <5)

## 2022-10-05 MED ORDER — FLEET ENEMA RE ENEM: 1.0000 | ENEMA | Freq: Once | RECTAL | 0 refills | Status: AC

## 2022-10-05 MED ORDER — POTASSIUM CHLORIDE 10 MEQ/100ML IV SOLN
10.0000 meq | INTRAVENOUS | Status: AC
  Administered 2022-10-05 (×2): 10 meq via INTRAVENOUS
  Filled 2022-10-05 (×2): qty 100

## 2022-10-05 MED ORDER — FENTANYL CITRATE PF 50 MCG/ML IJ SOSY
50.0000 ug | PREFILLED_SYRINGE | Freq: Once | INTRAMUSCULAR | Status: AC
  Administered 2022-10-05: 50 ug via INTRAVENOUS
  Filled 2022-10-05: qty 1

## 2022-10-05 MED ORDER — OXYCODONE-ACETAMINOPHEN 7.5-325 MG PO TABS: 1.0000 | ORAL_TABLET | Freq: Four times a day (QID) | ORAL | 0 refills | Status: DC | PRN

## 2022-10-05 MED ORDER — POLYETHYLENE GLYCOL 3350 17 GM/SCOOP PO POWD: 1.0000 | Freq: Once | ORAL | 0 refills | Status: AC

## 2022-10-05 MED ORDER — IOHEXOL 300 MG/ML  SOLN
100.0000 mL | Freq: Once | INTRAMUSCULAR | Status: AC | PRN
  Administered 2022-10-05: 100 mL via INTRAVENOUS

## 2022-10-05 MED ORDER — SODIUM CHLORIDE 0.9 % IV BOLUS
1000.0000 mL | Freq: Once | INTRAVENOUS | Status: AC
  Administered 2022-10-05: 1000 mL via INTRAVENOUS

## 2022-10-05 NOTE — Progress Notes (Signed)
CRITICAL VALUE STICKER  CRITICAL VALUE: lactic acid  MD NOTIFIED: Sherian Maroon, PA  TIME OF NOTIFICATION: 1204

## 2022-10-05 NOTE — ED Triage Notes (Signed)
Pt c/o lower back pain since Sunday, pt takes oxycodone 7.5 mg for slipped disc, last took at 3 am. Pt also not having a bowel movement in 2 weeks, states she took a laxative this past Thursday and had 1 small episode of diarrhea but nothing since.

## 2022-10-05 NOTE — ED Notes (Signed)
Pt went to the restroom but states she was unable to give a urine specimen.
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
## 2022-10-05 NOTE — Discharge Instructions (Addendum)
As discussed, workup today overall reassuring.  You do have a moderate amount of stool in your colon which I think is causing your feelings of bloating as well as constipation.  Regarding this, recommend beginning daily fiber supplementation in the outpatient setting.  You may try an enema at home to help get your bowel movements going.  Will recommend MiraLAX beginning with 3-4 capfuls you may increase as needed or decrease as needed for soft regular bowel movements.  Also recommend drinking adequate amounts of water to help hydrate your stool.  Recommend follow-up with your spinal specialist as well as pain specialist in the outpatient setting for reevaluation of your back pain.  Please do not hesitate to return to the emergency department if there are worrisome signs and symptoms we discussed to become apparent.

## 2023-05-04 ENCOUNTER — Emergency Department (HOSPITAL_COMMUNITY)

## 2023-05-04 ENCOUNTER — Emergency Department (HOSPITAL_COMMUNITY)
Admission: EM | Admit: 2023-05-04 | Discharge: 2023-05-04 | Disposition: A | Discharge status: Home / Self Care | Attending: Emergency Medicine | Admitting: Emergency Medicine

## 2023-05-04 ENCOUNTER — Other Ambulatory Visit: Payer: Self-pay

## 2023-05-04 ENCOUNTER — Encounter (HOSPITAL_COMMUNITY): Payer: Self-pay

## 2023-05-04 DIAGNOSIS — R0781 Pleurodynia: Secondary | ICD-10-CM | POA: Diagnosis present

## 2023-05-04 MED ORDER — MELOXICAM 15 MG PO TABS: 15.0000 mg | ORAL_TABLET | Freq: Every day | ORAL | 0 refills | Status: AC

## 2023-05-04 MED ORDER — OXYCODONE-ACETAMINOPHEN 5-325 MG PO TABS
2.0000 | ORAL_TABLET | Freq: Once | ORAL | Status: AC
  Administered 2023-05-04: 2 via ORAL
  Filled 2023-05-04: qty 2

## 2023-05-04 MED ORDER — METHOCARBAMOL 500 MG PO TABS: 500.0000 mg | ORAL_TABLET | Freq: Two times a day (BID) | ORAL | 0 refills | Status: AC | PRN

## 2023-05-04 NOTE — ED Notes (Signed)
 Pt endorses she is not able to stand up or lay flat to get her x ray she asked to wait until the pain medication starts working. X ray agreed and said she would come back for her.

## 2023-05-04 NOTE — ED Triage Notes (Signed)
 Pt stated that she woke up in excruciating pain in her lower back yesterday morning. Pt has long hx with her back but this specific time p[t states is unbearable. Pt slow during ambulation and pt stated that it hurts to take a deep breath.

## 2023-05-04 NOTE — Discharge Instructions (Addendum)
 Thankfully your x-rays did not show any signs of broken bones or abnormal lung findings.  you probably have some muscle spasms and strains in the chest wall overlying the ribs which are causing her significant discomfort.  You can take the medication that I prescribed including Mobic once a day and Robaxin twice a day as needed  Please take Mobic,  once daily as needed for pain - this in an antiinflammatory medicine (NSAID) and is similar to ibuprofen - many people feel that it is stronger than ibuprofen and it is easier to take since it is a smaller pill.  Please use this only for 1 week - if your pain persists, you will need to follow up with your doctor in the office for ongoing guidance and pain control.  Please take Robaxin, 500 mg up to 2 or 3 times a day as needed for muscle spasm, this is a muscle relaxer, it may cause generalized weakness, sleepiness and you should not drive or do important things while taking this medication.  This includes driving a vehicle or taking care of young children, these things should not be done while taking this medication.    Thank you for allowing [place] to treat you in the emergency department today.  After reviewing your examination and potential testing that was done it appears that you are safe to go home.  I would like for you to follow-up with your doctor within the next several days, have them obtain your records and follow-up with them to review all potential tests and results from your visit.  If you should develop severe or worsening symptoms return to the emergency department immediately
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
## 2023-05-04 NOTE — ED Notes (Signed)
 Nurse asked this tech to hook Pt up to the cardiac monitor. This tech then went into the pt's room and explained what I was about to do. The pt then stated " I am in excruciating pain and cannot lay on my back or roll to the side." Then she states " baby your going to have to tell the doctor to come in here because I cannot move."  This tech then went to find the Nurse and told her what was going on.

## 2023-05-04 NOTE — ED Notes (Signed)
 This RN made Xray aware that the pt feels she can go to X ray now.

## 2023-10-12 ENCOUNTER — Encounter: Payer: Self-pay | Admitting: Radiology

## 2023-10-25 ENCOUNTER — Emergency Department (HOSPITAL_COMMUNITY): Admission: EM | Admit: 2023-10-25 | Discharge: 2023-10-25 | Discharge status: Against Medical Advice

## 2023-10-25 NOTE — ED Notes (Signed)
 Before patient could get armband on, she told registration that she needed to get something out of her car and never came back in.

## 2023-12-24 ENCOUNTER — Encounter: Payer: Self-pay | Admitting: Radiology
